# Patient Record
Sex: Female | Born: 1935 | Race: White | Hispanic: No | Marital: Married | Smoking: Former smoker
Health system: Southern US, Community
[De-identification: ages and names within clinical notes are randomized; demographics above are authoritative.]

## PROBLEM LIST (undated history)

## (undated) DIAGNOSIS — E039 Hypothyroidism, unspecified: Secondary | ICD-10-CM

## (undated) DIAGNOSIS — M549 Dorsalgia, unspecified: Secondary | ICD-10-CM

## (undated) DIAGNOSIS — M1990 Unspecified osteoarthritis, unspecified site: Secondary | ICD-10-CM

## (undated) DIAGNOSIS — K219 Gastro-esophageal reflux disease without esophagitis: Secondary | ICD-10-CM

## (undated) DIAGNOSIS — R001 Bradycardia, unspecified: Secondary | ICD-10-CM

## (undated) DIAGNOSIS — I729 Aneurysm of unspecified site: Secondary | ICD-10-CM

## (undated) DIAGNOSIS — R519 Headache, unspecified: Secondary | ICD-10-CM

## (undated) DIAGNOSIS — I10 Essential (primary) hypertension: Secondary | ICD-10-CM

## (undated) DIAGNOSIS — R51 Headache: Secondary | ICD-10-CM

## (undated) DIAGNOSIS — E7800 Pure hypercholesterolemia, unspecified: Secondary | ICD-10-CM

## (undated) HISTORY — PX: CATARACT EXTRACTION, BILATERAL: SHX1313

## (undated) HISTORY — PX: TONSILLECTOMY: SUR1361

## (undated) HISTORY — PX: APPENDECTOMY: SHX54

---

## 2013-08-14 ENCOUNTER — Other Ambulatory Visit (HOSPITAL_COMMUNITY): Payer: Self-pay | Admitting: Neurology

## 2013-08-14 DIAGNOSIS — I729 Aneurysm of unspecified site: Secondary | ICD-10-CM

## 2013-08-18 ENCOUNTER — Telehealth (HOSPITAL_COMMUNITY): Payer: Self-pay | Admitting: Interventional Radiology

## 2013-08-18 ENCOUNTER — Ambulatory Visit (HOSPITAL_COMMUNITY): Admission: RE | Admit: 2013-08-18 | Payer: PRIVATE HEALTH INSURANCE | Source: Ambulatory Visit

## 2013-08-18 NOTE — Telephone Encounter (Signed)
Called pt left VM to find out if she was coming for her 1 p.m. appt with Deveshwar. Told her to call us back and let us know if she wanted to reschedule or cancel coming in to see Dr. Corliss Skainseveshwar. JM

## 2013-08-29 ENCOUNTER — Ambulatory Visit (HOSPITAL_COMMUNITY)
Admission: RE | Admit: 2013-08-29 | Discharge: 2013-08-29 | Disposition: A | Discharge status: Home / Self Care | Payer: PRIVATE HEALTH INSURANCE | Source: Ambulatory Visit | Attending: Neurology | Admitting: Neurology

## 2013-08-29 DIAGNOSIS — I729 Aneurysm of unspecified site: Secondary | ICD-10-CM

## 2013-08-31 ENCOUNTER — Other Ambulatory Visit (HOSPITAL_COMMUNITY): Payer: Self-pay | Admitting: Interventional Radiology

## 2013-08-31 ENCOUNTER — Other Ambulatory Visit: Payer: Self-pay | Admitting: Radiology

## 2013-08-31 DIAGNOSIS — R519 Headache, unspecified: Secondary | ICD-10-CM

## 2013-08-31 DIAGNOSIS — R42 Dizziness and giddiness: Secondary | ICD-10-CM

## 2013-08-31 DIAGNOSIS — I729 Aneurysm of unspecified site: Secondary | ICD-10-CM

## 2013-08-31 DIAGNOSIS — R51 Headache: Secondary | ICD-10-CM

## 2013-08-31 DIAGNOSIS — R112 Nausea with vomiting, unspecified: Secondary | ICD-10-CM

## 2013-09-01 ENCOUNTER — Other Ambulatory Visit (HOSPITAL_COMMUNITY): Payer: Self-pay | Admitting: Interventional Radiology

## 2013-09-01 ENCOUNTER — Ambulatory Visit (HOSPITAL_COMMUNITY)
Admission: RE | Admit: 2013-09-01 | Discharge: 2013-09-01 | Disposition: A | Discharge status: Home / Self Care | Payer: PRIVATE HEALTH INSURANCE | Source: Ambulatory Visit | Attending: Interventional Radiology | Admitting: Interventional Radiology

## 2013-09-01 DIAGNOSIS — R42 Dizziness and giddiness: Secondary | ICD-10-CM

## 2013-09-01 DIAGNOSIS — M545 Low back pain, unspecified: Secondary | ICD-10-CM | POA: Insufficient documentation

## 2013-09-01 DIAGNOSIS — R51 Headache: Secondary | ICD-10-CM

## 2013-09-01 DIAGNOSIS — R519 Headache, unspecified: Secondary | ICD-10-CM

## 2013-09-01 DIAGNOSIS — R112 Nausea with vomiting, unspecified: Secondary | ICD-10-CM

## 2013-09-01 DIAGNOSIS — I10 Essential (primary) hypertension: Secondary | ICD-10-CM | POA: Insufficient documentation

## 2013-09-01 DIAGNOSIS — M810 Age-related osteoporosis without current pathological fracture: Secondary | ICD-10-CM | POA: Insufficient documentation

## 2013-09-01 DIAGNOSIS — I729 Aneurysm of unspecified site: Secondary | ICD-10-CM

## 2013-09-01 DIAGNOSIS — I671 Cerebral aneurysm, nonruptured: Secondary | ICD-10-CM | POA: Insufficient documentation

## 2013-09-01 DIAGNOSIS — R413 Other amnesia: Secondary | ICD-10-CM | POA: Insufficient documentation

## 2013-09-01 LAB — CBC
HEMATOCRIT: 39.6 % (ref 36.0–46.0)
Hemoglobin: 13.7 g/dL (ref 12.0–15.0)
MCH: 31.4 pg (ref 26.0–34.0)
MCHC: 34.6 g/dL (ref 30.0–36.0)
MCV: 90.8 fL (ref 78.0–100.0)
PLATELETS: 129 10*3/uL — AB (ref 150–400)
RBC: 4.36 MIL/uL (ref 3.87–5.11)
RDW: 12.8 % (ref 11.5–15.5)
WBC: 6.4 10*3/uL (ref 4.0–10.5)

## 2013-09-01 LAB — BASIC METABOLIC PANEL
BUN: 16 mg/dL (ref 6–23)
CALCIUM: 9.9 mg/dL (ref 8.4–10.5)
CHLORIDE: 103 meq/L (ref 96–112)
CO2: 26 mEq/L (ref 19–32)
CREATININE: 0.9 mg/dL (ref 0.50–1.10)
GFR calc Af Amer: 66 mL/min — ABNORMAL LOW (ref >90)
GFR, EST NON AFRICAN AMERICAN: 57 mL/min — AB (ref >90)
Glucose, Bld: 112 mg/dL — ABNORMAL HIGH (ref 70–99)
Potassium: 4.1 mEq/L (ref 3.7–5.3)
Sodium: 142 mEq/L (ref 137–147)

## 2013-09-01 LAB — PROTIME-INR
INR: 1.03 (ref 0.00–1.49)
PROTHROMBIN TIME: 13.3 s (ref 11.6–15.2)

## 2013-09-01 LAB — APTT: APTT: 31 s (ref 24–37)

## 2013-09-01 MED ORDER — MIDAZOLAM HCL 2 MG/2ML IJ SOLN
INTRAMUSCULAR | Status: AC | PRN
  Administered 2013-09-01: 1 mg via INTRAVENOUS
  Administered 2013-09-01 (×2): 0.5 mg via INTRAVENOUS

## 2013-09-01 MED ORDER — IOHEXOL 300 MG/ML  SOLN
150.0000 mL | Freq: Once | INTRAMUSCULAR | Status: AC | PRN
  Administered 2013-09-01: 65 mL via INTRAVENOUS

## 2013-09-01 MED ORDER — FENTANYL CITRATE 0.05 MG/ML IJ SOLN
INTRAMUSCULAR | Status: AC
  Filled 2013-09-01: qty 4

## 2013-09-01 MED ORDER — MIDAZOLAM HCL 2 MG/2ML IJ SOLN
INTRAMUSCULAR | Status: AC
  Filled 2013-09-01: qty 4

## 2013-09-01 MED ORDER — SODIUM CHLORIDE 0.9 % IV SOLN: Freq: Once | INTRAVENOUS | Status: DC

## 2013-09-01 MED ORDER — HEPARIN SODIUM (PORCINE) 1000 UNIT/ML IJ SOLN
INTRAMUSCULAR | Status: AC | PRN
  Administered 2013-09-01: 1000 [IU] via INTRAVENOUS

## 2013-09-01 MED ORDER — SODIUM CHLORIDE 0.9 % IV SOLN: INTRAVENOUS | Status: AC

## 2013-09-01 MED ORDER — FENTANYL CITRATE 0.05 MG/ML IJ SOLN
INTRAMUSCULAR | Status: AC | PRN
  Administered 2013-09-01 (×2): 12.5 ug via INTRAVENOUS
  Administered 2013-09-01: 25 ug via INTRAVENOUS

## 2013-09-01 NOTE — Discharge Instructions (Signed)
Angiography, Care After °Refer to this sheet in the next few weeks. These instructions provide you with information on caring for yourself after your procedure. Your health care provider may also give you more specific instructions. Your treatment has been planned according to current medical practices, but problems sometimes occur. Call your health care provider if you have any problems or questions after your procedure.  °WHAT TO EXPECT AFTER THE PROCEDURE °After your procedure, it is typical to have the following sensations: °· Minor discomfort or tenderness and a small bump at the catheter insertion site. The bump should usually decrease in size and tenderness within 1 to 2 weeks. °· Any bruising will usually fade within 2 to 4 weeks. °HOME CARE INSTRUCTIONS  °· You may need to keep taking blood thinners if they were prescribed for you. Only take over-the-counter or prescription medicines for pain, fever, or discomfort as directed by your health care provider. °· Do not apply powder or lotion to the site. °· Do not sit in a bathtub, swimming pool, or whirlpool for 5 to 7 days. °· You may shower 24 hours after the procedure. Remove the bandage (dressing) and gently wash the site with plain soap and water. Gently pat the site dry. °· Inspect the site at least twice daily. °· Limit your activity for the first 48 hours. Do not bend, squat, or lift anything over 20 lb (9 kg) or as directed by your health care provider. °· Do not drive home if you are discharged the day of the procedure. Have someone else drive you. Follow instructions about when you can drive or return to work. °SEEK MEDICAL CARE IF: °· You get lightheaded when standing up. °· You have drainage (other than a small amount of blood on the dressing). °· You have chills. °· You have a fever. °· You have redness, warmth, swelling, or pain at the insertion site. °SEEK IMMEDIATE MEDICAL CARE IF:  °· You develop chest pain or shortness of breath, feel faint,  or pass out. °· You have bleeding, swelling larger than a walnut, or drainage from the catheter insertion site. °· You develop pain, discoloration, coldness, or severe bruising in the leg or arm that held the catheter. °· You develop bleeding from any other place, such as the bowels. You may see bright red blood in your urine or stools, or your stools may appear black and tarry. °· You have heavy bleeding from the site. If this happens, hold pressure on the site. °MAKE SURE YOU: °· Understand these instructions. °· Will watch your condition. °· Will get help right away if you are not doing well or get worse.  Call 911 if bleeding is not under control °Document Released: 12/25/2004 Document Revised: 02/08/2013 Document Reviewed: 10/31/2012 °[employer]® Patient Information ©2014 [employer]. ° °

## 2013-09-01 NOTE — Sedation Documentation (Signed)
MD at bedside, to relay diagnostic findings and future treatment plan discussed

## 2013-09-01 NOTE — Procedures (Signed)
S/P 4 vessel cerebral arteriogram RT CFA approach. Findings. 1.Approx 6.818mm x 4.5 mm wide necked LT ICA post wall intracranial aneurysm

## 2013-09-01 NOTE — H&P (Signed)
Kara ClinesLucy Lester is an 80 y.o. female.   Chief Complaint: headaches, left internal carotid artery aneurysm HPI: Pt with history of persistent headaches, primarily right frontal region, for past 2 years as well as recent episode associated with nausea and dizziness. Recent MRA head/neck at outside facility revealed left ICA aneurysm. She presents today for diagnostic cerebral arteriogram to assess vasculature.                                                                                                                                                                                            Surgical history: Appendectomy; tonsillectomy ; bilateral cataract  surgery  Past Medical History: Hypertension; headache; low back pain; memory  loss; osteoporosis                                                                                                                                                                Social History: Patient is married and [place]. She is with her husband. She is a  nonsmoker and nondrinker. She is retired Astronomerteacher/ counselor.                                                                                             Family History: Patient's mother died at age 78 of CAD; father died  with CAD 78 years old. She does not have any siblings  Allergies: penicillin Current outpatient prescriptions:amitriptyline (ELAVIL) 10 MG tablet, Take 10 mg by mouth at bedtime., Disp: , Rfl: ;  aspirin EC 81 MG tablet, Take 81 mg by mouth daily., Disp: , Rfl: ;  atenolol (TENORMIN) 50 MG tablet, Take 50-100 mg by mouth every other day., Disp: , Rfl: ;  atorvastatin (LIPITOR) 10 MG tablet, Take 10 mg by mouth daily at 6 PM., Disp: , Rfl:  bimatoprost (LUMIGAN) 0.01 % SOLN,  Place 1 drop into both eyes at bedtime., Disp: , Rfl: ;  butalbital-acetaminophen-caffeine (FIORICET, ESGIC) 50-325-40 MG per tablet, Take 1 tablet by mouth every 8 (eight) hours as needed for headache., Disp: , Rfl: ;  calcium-vitamin D (OSCAL WITH D) 500-200 MG-UNIT per tablet, Take 1 tablet by mouth 2 (two) times daily., Disp: , Rfl:  gabapentin (NEURONTIN) 300 MG capsule, Take 300 mg by mouth 2 (two) times daily., Disp: , Rfl: ;  indapamide (LOZOL) 1.25 MG tablet, Take 1.25 mg by mouth daily. , Disp: , Rfl: ;  Multiple Vitamin (MULTIVITAMIN WITH MINERALS) TABS tablet, Take 1 tablet by mouth daily., Disp: , Rfl:  Current facility-administered medications:0.9 %  sodium chloride infusion, , Intravenous, Once, Berneta Levins, PA-C   Results for orders placed during the hospital encounter of 09/01/13  CBC      Result Value Ref Range   WBC 6.4  4.0 - 10.5 K/uL   RBC 4.36  3.87 - 5.11 MIL/uL   Hemoglobin 13.7  12.0 - 15.0 g/dL   HCT 16.1  09.6 - 04.5 %   MCV 90.8  78.0 - 100.0 fL   MCH 31.4  26.0 - 34.0 pg   MCHC 34.6  30.0 - 36.0 g/dL   RDW 40.9  81.1 - 91.4 %   Platelets 129 (*) 150 - 400 K/uL    Review of Systems  Constitutional: Negative for fever and chills.  Eyes: Negative for blurred vision and double vision.  Respiratory: Negative for hemoptysis and shortness of breath.        Occ cough  Cardiovascular: Negative for chest pain.  Gastrointestinal: Negative for abdominal pain.       Occ N/V  Genitourinary: Negative for hematuria.  Musculoskeletal: Positive for back pain.  Neurological: Positive for dizziness and headaches. Negative for tingling, tremors, sensory change, speech change, focal weakness, seizures and loss of consciousness.  Endo/Heme/Allergies: Does not bruise/bleed easily.  Psychiatric/Behavioral: The patient is nervous/anxious.        Some trouble remembering names    Blood pressure 130/75, pulse 55, temperature 98.1 F (36.7 C), temperature source Oral, resp.  rate 18, height 5\' 3"  (1.6 m), weight 152 lb (68.947 kg), SpO2 100.00%. Physical Exam  Constitutional: She is oriented to person, place, and time. She appears well-developed and well-nourished.  HENT:  Head: Normocephalic and atraumatic.  Eyes: EOM are normal. Pupils are equal, round, and reactive to light.  Cardiovascular:  Sl brady but reg rhythm  Respiratory: Effort normal.  Few left basilar crackles, right clear  GI: Soft. Bowel sounds are normal. There is no tenderness.  Musculoskeletal: Normal range of motion. She exhibits no edema.  Neurological: She is alert and oriented to person, place, and time. No cranial nerve deficit. Coordination normal.  Skin: Skin is warm and dry.     Assessment/Plan Pt with history of persistent headaches, primarily right frontal region, for past 2 years as well as recent episode associated with nausea and dizziness. Recent MRA head/neck at outside facility revealed left ICA aneurysm.  She presents today for diagnostic cerebral arteriogram to assess vasculature. Details/risks of procedure d/w pt/husband with their understanding and consent.  Kara Lester,D KEVIN 09/01/2013, 10:43 AM

## 2013-09-01 NOTE — Sedation Documentation (Signed)
Patient denies pain and is resting comfortably.  

## 2013-09-01 NOTE — Progress Notes (Signed)
Received pt from Radiology alert and denies any discomfort.

## 2013-09-01 NOTE — Progress Notes (Signed)
Discharge instruction given per MD order.  Pt and CG able to verbalize understanding.  Pt to car via wheelchair. 

## 2013-09-04 ENCOUNTER — Other Ambulatory Visit (HOSPITAL_COMMUNITY): Payer: Self-pay | Admitting: Interventional Radiology

## 2013-09-04 DIAGNOSIS — I729 Aneurysm of unspecified site: Secondary | ICD-10-CM

## 2013-09-13 ENCOUNTER — Ambulatory Visit (HOSPITAL_COMMUNITY)
Admission: RE | Admit: 2013-09-13 | Discharge: 2013-09-13 | Disposition: A | Discharge status: Home / Self Care | Payer: PRIVATE HEALTH INSURANCE | Source: Ambulatory Visit | Attending: Interventional Radiology | Admitting: Interventional Radiology

## 2013-09-13 DIAGNOSIS — I729 Aneurysm of unspecified site: Secondary | ICD-10-CM

## 2013-09-14 ENCOUNTER — Telehealth (HOSPITAL_COMMUNITY): Payer: Self-pay | Admitting: Interventional Radiology

## 2013-09-14 NOTE — Telephone Encounter (Signed)
Pt called to tell me that she would like to take a week or so to really think about whether or not she wants to go through with the proposed procedure. She will call me back when she has made her decision. JM

## 2013-10-03 ENCOUNTER — Other Ambulatory Visit (HOSPITAL_COMMUNITY): Payer: Self-pay | Admitting: Interventional Radiology

## 2015-08-08 NOTE — H&P (Signed)
TOTAL HIP ADMISSION H&P  Patient is admitted for left total hip arthroplasty, anterior approach.  Subjective:  Chief Complaint:   Left hip primary OA / pain  HPI: Kara Lester, 82 y.o. female, has a history of pain and functional disability in the left hip(s) due to arthritis and patient has failed non-surgical conservative treatments for greater than 12 weeks to include NSAID's and/or analgesics, corticosteriod injections, use of assistive devices and activity modification.  Onset of symptoms was gradual starting 2+ years ago with gradually worsening course since that time.The patient noted no past surgery on the left hip(s).  Patient currently rates pain in the left hip at 8 out of 10 with activity. Patient has night pain, worsening of pain with activity and weight bearing, trendelenberg gait, pain that interfers with activities of daily living and pain with passive range of motion. Patient has evidence of periarticular osteophytes and joint space narrowing by imaging studies. This condition presents safety issues increasing the risk of falls.  There is no current active infection.   Risks, benefits and expectations were discussed with the patient.  Risks including but not limited to the risk of anesthesia, blood clots, nerve damage, blood vessel damage, failure of the prosthesis, infection and up to and including death.  Patient understand the risks, benefits and expectations and wishes to proceed with surgery.   PCP: Nadara Eaton, MD  D/C Plans:      SNF  Post-op Meds:       No Rx given  Tranexamic Acid:      To be given - IV   Decadron:      Is to be given  FYI:     ASA  Norco    Past Medical History  Diagnosis Date  . Hypertension   . Heart rate slow     history of  . Aneurysm (HCC)     cerebral" no surgical repair"- frquent headaches . or lessening-Dr. Tonuzi-[phone],neurology  . Hypercholesteremia   . Hypothyroidism     childhood years- no issues  . GERD  (gastroesophageal reflux disease)   . Arthritis     Osteoarthritis- "spinal stenosis"- hip and back pain.    Past Surgical History  Procedure Laterality Date  . Cataract extraction, bilateral Bilateral   . Appendectomy    . Tonsillectomy      No prescriptions prior to admission   Allergies  Allergen Reactions  . Penicillins Rash    Has patient had a PCN reaction causing immediate rash, facial/tongue/throat swelling, SOB or lightheadedness with hypotension: Yes Has patient had a PCN reaction causing severe rash involving mucus membranes or skin necrosis: No Has patient had a PCN reaction that required hospitalization No Has patient had a PCN reaction occurring within the last 10 years: Yes If all of the above answers are "NO", then may proceed with Cephalosporin use.    Social History  Substance Use Topics  . Smoking status: Former Smoker    Types: Cigarettes  . Smokeless tobacco: Not on file     Comment: age 9- quit"social smoker only"  . Alcohol Use: Yes     Comment: rare now.       Review of Systems  Constitutional: Negative.   HENT: Negative.   Eyes: Negative.   Respiratory: Positive for cough.   Cardiovascular: Negative.   Gastrointestinal: Positive for heartburn and constipation.  Genitourinary: Negative.   Musculoskeletal: Positive for joint pain.  Skin: Negative.   Neurological: Negative.   Endo/Heme/Allergies: Negative.  Psychiatric/Behavioral: Negative.     Objective:  Physical Exam  Constitutional: She is oriented to person, place, and time. She appears well-developed.  HENT:  Head: Normocephalic.  Eyes: Pupils are equal, round, and reactive to light.  Neck: Neck supple. No JVD present. No tracheal deviation present. No thyromegaly present.  Cardiovascular: Normal rate, regular rhythm and intact distal pulses.   Murmur heard. Respiratory: Effort normal and breath sounds normal. No stridor. No respiratory distress. She has no wheezes.  GI: Soft.  There is no tenderness. There is no guarding.  Musculoskeletal:       Left hip: She exhibits decreased range of motion, decreased strength, tenderness and bony tenderness. She exhibits no swelling, no deformity and no laceration.  Lymphadenopathy:    She has no cervical adenopathy.  Neurological: She is alert and oriented to person, place, and time.  Skin: Skin is warm and dry.  Psychiatric: She has a normal mood and affect.      Labs:  Estimated body mass index is 26.93 kg/(m^2) as calculated from the following:   Height as of 09/01/13:  (1.6 m).   Weight as of 09/01/13: 68.947 kg (152 lb).   Imaging Review Plain radiographs demonstrate severe degenerative joint disease of the left hip(s). The bone quality appears to be good for age and reported activity level.  Assessment/Plan:  End stage arthritis, left hip(s)  The patient history, physical examination, clinical judgement of the provider and imaging studies are consistent with end stage degenerative joint disease of the left hip(s) and total hip arthroplasty is deemed medically necessary. The treatment options including medical management, injection therapy, arthroscopy and arthroplasty were discussed at length. The risks and benefits of total hip arthroplasty were presented and reviewed. The risks due to aseptic loosening, infection, stiffness, dislocation/subluxation,  thromboembolic complications and other imponderables were discussed.  The patient acknowledged the explanation, agreed to proceed with the plan and consent was signed. Patient is being admitted for inpatient treatment for surgery, pain control, PT, OT, prophylactic antibiotics, VTE prophylaxis, progressive ambulation and ADL's and discharge planning.The patient is planning to be discharged to skilled nursing facility.     Anastasio Auerbach Jennifr Gaeta   PA-C  08/19/2015, 3:21 PM

## 2015-08-19 ENCOUNTER — Encounter (HOSPITAL_COMMUNITY): Payer: Self-pay

## 2015-08-19 ENCOUNTER — Encounter (HOSPITAL_COMMUNITY)
Admission: RE | Admit: 2015-08-19 | Discharge: 2015-08-19 | Disposition: A | Discharge status: Home / Self Care | Payer: Medicare Other | Source: Ambulatory Visit | Attending: Orthopedic Surgery | Admitting: Orthopedic Surgery

## 2015-08-19 DIAGNOSIS — M1612 Unilateral primary osteoarthritis, left hip: Secondary | ICD-10-CM | POA: Diagnosis not present

## 2015-08-19 DIAGNOSIS — Z01812 Encounter for preprocedural laboratory examination: Secondary | ICD-10-CM | POA: Insufficient documentation

## 2015-08-19 DIAGNOSIS — Z0183 Encounter for blood typing: Secondary | ICD-10-CM | POA: Diagnosis not present

## 2015-08-19 HISTORY — DX: Essential (primary) hypertension: I10

## 2015-08-19 HISTORY — DX: Bradycardia, unspecified: R00.1

## 2015-08-19 HISTORY — DX: Pure hypercholesterolemia, unspecified: E78.00

## 2015-08-19 HISTORY — DX: Unspecified osteoarthritis, unspecified site: M19.90

## 2015-08-19 HISTORY — DX: Hypothyroidism, unspecified: E03.9

## 2015-08-19 HISTORY — DX: Aneurysm of unspecified site: I72.9

## 2015-08-19 HISTORY — DX: Gastro-esophageal reflux disease without esophagitis: K21.9

## 2015-08-19 LAB — CBC
HEMATOCRIT: 41.4 % (ref 36.0–46.0)
Hemoglobin: 13.4 g/dL (ref 12.0–15.0)
MCH: 31.3 pg (ref 26.0–34.0)
MCHC: 32.4 g/dL (ref 30.0–36.0)
MCV: 96.7 fL (ref 78.0–100.0)
Platelets: 137 10*3/uL — ABNORMAL LOW (ref 150–400)
RBC: 4.28 MIL/uL (ref 3.87–5.11)
RDW: 13.6 % (ref 11.5–15.5)
WBC: 7.6 10*3/uL (ref 4.0–10.5)

## 2015-08-19 LAB — PROTIME-INR
INR: 1.13 (ref 0.00–1.49)
Prothrombin Time: 14.3 seconds (ref 11.6–15.2)

## 2015-08-19 LAB — URINALYSIS, ROUTINE W REFLEX MICROSCOPIC
Bilirubin Urine: NEGATIVE
Glucose, UA: NEGATIVE mg/dL
HGB URINE DIPSTICK: NEGATIVE
Ketones, ur: NEGATIVE mg/dL
Nitrite: NEGATIVE
PROTEIN: NEGATIVE mg/dL
Specific Gravity, Urine: 1.004 — ABNORMAL LOW (ref 1.005–1.030)
pH: 5.5 (ref 5.0–8.0)

## 2015-08-19 LAB — URINE MICROSCOPIC-ADD ON

## 2015-08-19 LAB — BASIC METABOLIC PANEL
Anion gap: 8 (ref 5–15)
BUN: 17 mg/dL (ref 6–20)
CHLORIDE: 104 mmol/L (ref 101–111)
CO2: 27 mmol/L (ref 22–32)
Calcium: 9.8 mg/dL (ref 8.9–10.3)
Creatinine, Ser: 0.74 mg/dL (ref 0.44–1.00)
GFR calc Af Amer: >60 mL/min (ref >60)
GFR calc non Af Amer: >60 mL/min (ref >60)
GLUCOSE: 106 mg/dL — AB (ref 65–99)
POTASSIUM: 5.6 mmol/L — AB (ref 3.5–5.1)
Sodium: 139 mmol/L (ref 135–145)

## 2015-08-19 LAB — SURGICAL PCR SCREEN
MRSA, PCR: NEGATIVE
STAPHYLOCOCCUS AUREUS: NEGATIVE

## 2015-08-19 LAB — APTT: aPTT: 33 seconds (ref 24–37)

## 2015-08-19 LAB — ABO/RH: ABO/RH(D): A POS

## 2015-08-19 NOTE — Pre-Procedure Instructions (Signed)
EKG 08-12-15 with chart . LOV notes Dr. Leanora Cover note with chart.

## 2015-08-19 NOTE — Patient Instructions (Signed)
Kara Lester  08/19/2015   Your procedure is scheduled on: 08-27-15   Report to [facility] Main  Entrance take [facility]  elevators to 3rd floor to  Short Stay Center at   0830 AM.  Call this number if you have problems the morning of surgery [phone]   Remember: ONLY 1 PERSON MAY GO WITH YOU TO SHORT STAY TO GET  READY MORNING OF YOUR SURGERY.  Do not eat food or drink liquids :After Midnight.     Take these medicines the morning of surgery with A SIP OF WATER: Atenolol. Gabapentin. Use/bring eye drops. DO NOT TAKE ANY DIABETIC MEDICATIONS DAY OF YOUR SURGERY                               You may not have any metal on your body including hair pins and              piercings  Do not wear jewelry, make-up, lotions, powders or perfumes, deodorant             Do not wear nail polish.  Do not shave  48 hours prior to surgery.              Men may shave face and neck.   Do not bring valuables to the hospital. [facility] IS NOT             RESPONSIBLE   FOR VALUABLES.  Contacts, dentures or bridgework may not be worn into surgery.  Leave suitcase in the car. After surgery it may be brought to your room.     Patients discharged the day of surgery will not be allowed to drive home.  Name and phone number of your driver: Kara Lester -[phone]   Special Instructions: N/A              Please read over the following fact sheets you were given: _____________________________________________________________________             [facility] - Preparing for Surgery Before surgery, you can play an important role.  Because skin is not sterile, your skin needs to be as free of germs as possible.  You can reduce the number of germs on your skin by washing with CHG (chlorahexidine gluconate) soap before surgery.  CHG is an antiseptic cleaner which kills germs and bonds with the skin to continue killing germs even after washing. Please DO NOT use if you have an allergy  to CHG or antibacterial soaps.  If your skin becomes reddened/irritated stop using the CHG and inform your nurse when you arrive at Short Stay. Do not shave (including legs and underarms) for at least 48 hours prior to the first CHG shower.  You may shave your face/neck. Please follow these instructions carefully:  1.  Shower with CHG Soap the night before surgery and the  morning of Surgery.  2.  If you choose to wash your hair, wash your hair first as usual with your  normal  shampoo.  3.  After you shampoo, rinse your hair and body thoroughly to remove the  shampoo.                           4.  Use CHG as you would any other liquid soap.  You can  apply chg directly  to the skin and wash                       Gently with a scrungie or clean washcloth.  5.  Apply the CHG Soap to your body ONLY FROM THE NECK DOWN.   Do not use on face/ open                           Wound or open sores. Avoid contact with eyes, ears mouth and genitals (private parts).                       Wash face,  Genitals (private parts) with your normal soap.             6.  Wash thoroughly, paying special attention to the area where your surgery  will be performed.  7.  Thoroughly rinse your body with warm water from the neck down.  8.  DO NOT shower/wash with your normal soap after using and rinsing off  the CHG Soap.                9.  Pat yourself dry with a clean towel.            10.  Wear clean pajamas.            11.  Place clean sheets on your bed the night of your first shower and do not  sleep with pets. Day of Surgery : Do not apply any lotions/deodorants the morning of surgery.  Please wear clean clothes to the hospital/surgery center.  FAILURE TO FOLLOW THESE INSTRUCTIONS MAY RESULT IN THE CANCELLATION OF YOUR SURGERY PATIENT SIGNATURE_________________________________  NURSE SIGNATURE__________________________________  ________________________________________________________________________   Kara Lester  An incentive spirometer is a tool that can help keep your lungs clear and active. This tool measures how well you are filling your lungs with each breath. Taking long deep breaths may help reverse or decrease the chance of developing breathing (pulmonary) problems (especially infection) following:  A long period of time when you are unable to move or be active. BEFORE THE PROCEDURE   If the spirometer includes an indicator to show your best effort, your nurse or respiratory therapist will set it to a desired goal.  If possible, sit up straight or lean slightly forward. Try not to slouch.  Hold the incentive spirometer in an upright position. INSTRUCTIONS FOR USE   Sit on the edge of your bed if possible, or sit up as far as you can in bed or on a chair.  Hold the incentive spirometer in an upright position.  Breathe out normally.  Place the mouthpiece in your mouth and seal your lips tightly around it.  Breathe in slowly and as deeply as possible, raising the piston or the ball toward the top of the column.  Hold your breath for 3-5 seconds or for as long as possible. Allow the piston or ball to fall to the bottom of the column.  Remove the mouthpiece from your mouth and breathe out normally.  Rest for a few seconds and repeat Steps 1 through 7 at least 10 times every 1-2 hours when you are awake. Take your time and take a few normal breaths between deep breaths.  The spirometer may include an indicator to show your best effort. Use the indicator as a goal to work toward during each  repetition.  After each set of 10 deep breaths, practice coughing to be sure your lungs are clear. If you have an incision (the cut made at the time of surgery), support your incision when coughing by placing a pillow or rolled up towels firmly against it. Once you are able to get out of bed, walk around indoors and cough well. You may stop using the incentive spirometer when instructed by  your caregiver.  RISKS AND COMPLICATIONS  Take your time so you do not get dizzy or light-headed.  If you are in pain, you may need to take or ask for pain medication before doing incentive spirometry. It is harder to take a deep breath if you are having pain. AFTER USE  Rest and breathe slowly and easily.  It can be helpful to keep track of a log of your progress. Your caregiver can provide you with a simple table to help with this. If you are using the spirometer at home, follow these instructions: [employer] IF:   You are having difficultly using the spirometer.  You have trouble using the spirometer as often as instructed.  Your pain medication is not giving enough relief while using the spirometer.  You develop fever of 100.5 F (38.1 C) or higher. SEEK IMMEDIATE MEDICAL CARE IF:   You cough up bloody sputum that had not been present before.  You develop fever of 102 F (38.9 C) or greater.  You develop worsening pain at or near the incision site. MAKE SURE YOU:   Understand these instructions.  Will watch your condition.  Will get help right away if you are not doing well or get worse. Document Released: 10/19/2006 Document Revised: 08/31/2011 Document Reviewed: 12/20/2006 Exit[employer] Patient Information 2014 Exit[employer], [place].   ________________________________________________________________________  WHAT IS A BLOOD TRANSFUSION? Blood Transfusion Information  A transfusion is the replacement of blood or some of its parts. Blood is made up of multiple cells which provide different functions.  Red blood cells carry oxygen and are used for blood loss replacement.  White blood cells fight against infection.  Platelets control bleeding.  Plasma helps clot blood.  Other blood products are available for specialized needs, such as hemophilia or other clotting disorders. BEFORE THE TRANSFUSION  Who gives blood for transfusions?   Healthy volunteers who are  fully evaluated to make sure their blood is safe. This is blood bank blood. Transfusion therapy is the safest it has ever been in the practice of medicine. Before blood is taken from a donor, a complete history is taken to make sure that person has no history of diseases nor engages in risky social behavior (examples are intravenous drug use or sexual activity with multiple partners). The donor's travel history is screened to minimize risk of transmitting infections, such as malaria. The donated blood is tested for signs of infectious diseases, such as HIV and hepatitis. The blood is then tested to be sure it is compatible with you in order to minimize the chance of a transfusion reaction. If you or a relative donates blood, this is often done in anticipation of surgery and is not appropriate for emergency situations. It takes many days to process the donated blood. RISKS AND COMPLICATIONS Although transfusion therapy is very safe and saves many lives, the main dangers of transfusion include:   Getting an infectious disease.  Developing a transfusion reaction. This is an allergic reaction to something in the blood you were given. Every precaution is taken to prevent this.  The decision to have a blood transfusion has been considered carefully by your caregiver before blood is given. Blood is not given unless the benefits outweigh the risks. AFTER THE TRANSFUSION  Right after receiving a blood transfusion, you will usually feel much better and more energetic. This is especially true if your red blood cells have gotten low (anemic). The transfusion raises the level of the red blood cells which carry oxygen, and this usually causes an energy increase.  The nurse administering the transfusion will monitor you carefully for complications. HOME CARE INSTRUCTIONS  No special instructions are needed after a transfusion. You may find your energy is better. Speak with your caregiver about any limitations on  activity for underlying diseases you may have. SEEK MEDICAL CARE IF:   Your condition is not improving after your transfusion.  You develop redness or irritation at the intravenous (IV) site. SEEK IMMEDIATE MEDICAL CARE IF:  Any of the following symptoms occur over the next 12 hours:  Shaking chills.  You have a temperature by mouth above 102 F (38.9 C), not controlled by medicine.  Chest, back, or muscle pain.  People around you feel you are not acting correctly or are confused.  Shortness of breath or difficulty breathing.  Dizziness and fainting.  You get a rash or develop hives.  You have a decrease in urine output.  Your urine turns a dark color or changes to pink, red, or brown. Any of the following symptoms occur over the next 10 days:  You have a temperature by mouth above 102 F (38.9 C), not controlled by medicine.  Shortness of breath.  Weakness after normal activity.  The white part of the eye turns yellow (jaundice).  You have a decrease in the amount of urine or are urinating less often.  Your urine turns a dark color or changes to pink, red, or brown. Document Released: 06/05/2000 Document Revised: 08/31/2011 Document Reviewed: 01/23/2008 [facility] Patient Information 2014 [place], [place].  _______________________________________________________________________

## 2015-08-27 ENCOUNTER — Inpatient Hospital Stay (HOSPITAL_COMMUNITY): Payer: Medicare Other

## 2015-08-27 ENCOUNTER — Inpatient Hospital Stay (HOSPITAL_COMMUNITY): Payer: Medicare Other | Admitting: Anesthesiology

## 2015-08-27 ENCOUNTER — Encounter (HOSPITAL_COMMUNITY)
Admission: RE | Disposition: A | Discharge status: Skilled Nursing Facility | Payer: Self-pay | Source: Ambulatory Visit | Attending: Orthopedic Surgery

## 2015-08-27 ENCOUNTER — Inpatient Hospital Stay (HOSPITAL_COMMUNITY)
Admission: RE | Admit: 2015-08-27 | Discharge: 2015-08-29 | DRG: 470 | Disposition: A | Discharge status: Skilled Nursing Facility | Payer: Medicare Other | Source: Ambulatory Visit | Attending: Orthopedic Surgery | Admitting: Orthopedic Surgery

## 2015-08-27 ENCOUNTER — Encounter (HOSPITAL_COMMUNITY): Payer: Self-pay

## 2015-08-27 DIAGNOSIS — M25552 Pain in left hip: Secondary | ICD-10-CM | POA: Diagnosis present

## 2015-08-27 DIAGNOSIS — Z87891 Personal history of nicotine dependence: Secondary | ICD-10-CM

## 2015-08-27 DIAGNOSIS — M1612 Unilateral primary osteoarthritis, left hip: Principal | ICD-10-CM | POA: Diagnosis present

## 2015-08-27 DIAGNOSIS — E663 Overweight: Secondary | ICD-10-CM | POA: Diagnosis present

## 2015-08-27 DIAGNOSIS — Z01812 Encounter for preprocedural laboratory examination: Secondary | ICD-10-CM

## 2015-08-27 DIAGNOSIS — Z880 Allergy status to penicillin: Secondary | ICD-10-CM

## 2015-08-27 DIAGNOSIS — I10 Essential (primary) hypertension: Secondary | ICD-10-CM | POA: Diagnosis present

## 2015-08-27 DIAGNOSIS — Z96649 Presence of unspecified artificial hip joint: Secondary | ICD-10-CM

## 2015-08-27 DIAGNOSIS — E7800 Pure hypercholesterolemia, unspecified: Secondary | ICD-10-CM | POA: Diagnosis present

## 2015-08-27 DIAGNOSIS — Z6826 Body mass index (BMI) 26.0-26.9, adult: Secondary | ICD-10-CM

## 2015-08-27 DIAGNOSIS — K219 Gastro-esophageal reflux disease without esophagitis: Secondary | ICD-10-CM | POA: Diagnosis present

## 2015-08-27 DIAGNOSIS — E039 Hypothyroidism, unspecified: Secondary | ICD-10-CM | POA: Diagnosis present

## 2015-08-27 HISTORY — PX: TOTAL HIP ARTHROPLASTY: SHX124

## 2015-08-27 LAB — TYPE AND SCREEN
ABO/RH(D): A POS
Antibody Screen: NEGATIVE

## 2015-08-27 SURGERY — ARTHROPLASTY, HIP, TOTAL, ANTERIOR APPROACH
Anesthesia: Spinal | Site: Hip | Laterality: Left

## 2015-08-27 MED ORDER — PHENYLEPHRINE 40 MCG/ML (10ML) SYRINGE FOR IV PUSH (FOR BLOOD PRESSURE SUPPORT)
PREFILLED_SYRINGE | INTRAVENOUS | Status: AC
Start: 2015-08-27 — End: 2015-08-27
  Filled 2015-08-27: qty 10

## 2015-08-27 MED ORDER — ONDANSETRON HCL 4 MG PO TABS: 4.0000 mg | ORAL_TABLET | Freq: Four times a day (QID) | ORAL | Status: DC | PRN

## 2015-08-27 MED ORDER — DIPHENHYDRAMINE HCL 25 MG PO CAPS: 25.0000 mg | ORAL_CAPSULE | Freq: Four times a day (QID) | ORAL | Status: DC | PRN

## 2015-08-27 MED ORDER — SODIUM CHLORIDE 0.9 % IV SOLN
INTRAVENOUS | Status: DC
  Administered 2015-08-27 – 2015-08-28 (×2): via INTRAVENOUS

## 2015-08-27 MED ORDER — CEFAZOLIN SODIUM-DEXTROSE 2-3 GM-% IV SOLR
INTRAVENOUS | Status: AC
  Filled 2015-08-27: qty 50

## 2015-08-27 MED ORDER — BUPIVACAINE IN DEXTROSE 0.75-8.25 % IT SOLN
INTRATHECAL | Status: DC | PRN
  Administered 2015-08-27: 1.8 mL via INTRATHECAL

## 2015-08-27 MED ORDER — FERROUS SULFATE 325 (65 FE) MG PO TABS
325.0000 mg | ORAL_TABLET | Freq: Three times a day (TID) | ORAL | Status: DC
  Administered 2015-08-28 – 2015-08-29 (×4): 325 mg via ORAL
  Filled 2015-08-27 (×7): qty 1

## 2015-08-27 MED ORDER — LACTATED RINGERS IV SOLN
INTRAVENOUS | Status: DC
  Administered 2015-08-27: 1000 mL via INTRAVENOUS
  Administered 2015-08-27: 12:00:00 via INTRAVENOUS

## 2015-08-27 MED ORDER — METOCLOPRAMIDE HCL 5 MG/ML IJ SOLN: 5.0000 mg | Freq: Three times a day (TID) | INTRAMUSCULAR | Status: DC | PRN

## 2015-08-27 MED ORDER — POLYETHYLENE GLYCOL 3350 17 G PO PACK
17.0000 g | PACK | Freq: Two times a day (BID) | ORAL | Status: DC
  Administered 2015-08-28 – 2015-08-29 (×3): 17 g via ORAL

## 2015-08-27 MED ORDER — TRANEXAMIC ACID 1000 MG/10ML IV SOLN
1000.0000 mg | Freq: Once | INTRAVENOUS | Status: AC
  Administered 2015-08-27: 1000 mg via INTRAVENOUS
  Filled 2015-08-27: qty 10

## 2015-08-27 MED ORDER — ATORVASTATIN CALCIUM 10 MG PO TABS
10.0000 mg | ORAL_TABLET | Freq: Every evening | ORAL | Status: DC
  Administered 2015-08-27 – 2015-08-28 (×2): 10 mg via ORAL
  Filled 2015-08-27 (×3): qty 1

## 2015-08-27 MED ORDER — VANCOMYCIN HCL IN DEXTROSE 1-5 GM/200ML-% IV SOLN
1000.0000 mg | INTRAVENOUS | Status: AC
  Administered 2015-08-27: 1000 mg via INTRAVENOUS
  Filled 2015-08-27: qty 200

## 2015-08-27 MED ORDER — HYDROMORPHONE HCL 1 MG/ML IJ SOLN
INTRAMUSCULAR | Status: AC
  Filled 2015-08-27: qty 1

## 2015-08-27 MED ORDER — HYDROMORPHONE HCL 1 MG/ML IJ SOLN: 0.5000 mg | INTRAMUSCULAR | Status: DC | PRN

## 2015-08-27 MED ORDER — LATANOPROST 0.005 % OP SOLN
1.0000 [drp] | Freq: Every day | OPHTHALMIC | Status: DC
  Administered 2015-08-27 – 2015-08-28 (×2): 1 [drp] via OPHTHALMIC
  Filled 2015-08-27: qty 2.5

## 2015-08-27 MED ORDER — ATENOLOL 100 MG PO TABS
100.0000 mg | ORAL_TABLET | ORAL | Status: DC
  Administered 2015-08-29: 100 mg via ORAL
  Filled 2015-08-27: qty 1

## 2015-08-27 MED ORDER — MENTHOL 3 MG MT LOZG: 1.0000 | LOZENGE | OROMUCOSAL | Status: DC | PRN

## 2015-08-27 MED ORDER — HYDROMORPHONE HCL 1 MG/ML IJ SOLN
0.2500 mg | INTRAMUSCULAR | Status: DC | PRN
  Administered 2015-08-27 (×2): 0.5 mg via INTRAVENOUS

## 2015-08-27 MED ORDER — GABAPENTIN 300 MG PO CAPS
300.0000 mg | ORAL_CAPSULE | Freq: Three times a day (TID) | ORAL | Status: DC
  Administered 2015-08-27 – 2015-08-29 (×6): 300 mg via ORAL
  Filled 2015-08-27 (×8): qty 1

## 2015-08-27 MED ORDER — METHOCARBAMOL 1000 MG/10ML IJ SOLN
500.0000 mg | Freq: Four times a day (QID) | INTRAVENOUS | Status: DC | PRN
  Administered 2015-08-27: 500 mg via INTRAVENOUS
  Filled 2015-08-27 (×2): qty 5

## 2015-08-27 MED ORDER — METOCLOPRAMIDE HCL 10 MG PO TABS: 5.0000 mg | ORAL_TABLET | Freq: Three times a day (TID) | ORAL | Status: DC | PRN

## 2015-08-27 MED ORDER — PROPOFOL 500 MG/50ML IV EMUL
INTRAVENOUS | Status: DC | PRN
  Administered 2015-08-27: 25 ug/kg/min via INTRAVENOUS

## 2015-08-27 MED ORDER — ALUM & MAG HYDROXIDE-SIMETH 200-200-20 MG/5ML PO SUSP: 30.0000 mL | ORAL | Status: DC | PRN

## 2015-08-27 MED ORDER — PROMETHAZINE HCL 25 MG/ML IJ SOLN: 6.2500 mg | INTRAMUSCULAR | Status: DC | PRN

## 2015-08-27 MED ORDER — VANCOMYCIN HCL IN DEXTROSE 1-5 GM/200ML-% IV SOLN
1000.0000 mg | Freq: Two times a day (BID) | INTRAVENOUS | Status: AC
  Administered 2015-08-27: 1000 mg via INTRAVENOUS
  Filled 2015-08-27: qty 200

## 2015-08-27 MED ORDER — ATENOLOL 50 MG PO TABS
50.0000 mg | ORAL_TABLET | ORAL | Status: DC
  Administered 2015-08-28: 50 mg via ORAL
  Filled 2015-08-27: qty 1

## 2015-08-27 MED ORDER — PHENOL 1.4 % MT LIQD: 1.0000 | OROMUCOSAL | Status: DC | PRN

## 2015-08-27 MED ORDER — METHOCARBAMOL 500 MG PO TABS: 500.0000 mg | ORAL_TABLET | Freq: Four times a day (QID) | ORAL | Status: DC | PRN

## 2015-08-27 MED ORDER — MAGNESIUM CITRATE PO SOLN: 1.0000 | Freq: Once | ORAL | Status: DC | PRN

## 2015-08-27 MED ORDER — DEXAMETHASONE SODIUM PHOSPHATE 10 MG/ML IJ SOLN
10.0000 mg | Freq: Once | INTRAMUSCULAR | Status: AC
  Administered 2015-08-27: 10 mg via INTRAVENOUS

## 2015-08-27 MED ORDER — PHENYLEPHRINE HCL 10 MG/ML IJ SOLN
INTRAMUSCULAR | Status: DC | PRN
  Administered 2015-08-27 (×2): 40 ug via INTRAVENOUS

## 2015-08-27 MED ORDER — DEXAMETHASONE SODIUM PHOSPHATE 10 MG/ML IJ SOLN
INTRAMUSCULAR | Status: AC
  Filled 2015-08-27: qty 1

## 2015-08-27 MED ORDER — DOCUSATE SODIUM 100 MG PO CAPS
100.0000 mg | ORAL_CAPSULE | Freq: Two times a day (BID) | ORAL | Status: DC
  Administered 2015-08-27 – 2015-08-29 (×4): 100 mg via ORAL

## 2015-08-27 MED ORDER — BISACODYL 10 MG RE SUPP
10.0000 mg | Freq: Every day | RECTAL | Status: DC | PRN
  Filled 2015-08-27: qty 1

## 2015-08-27 MED ORDER — MIDAZOLAM HCL 2 MG/2ML IJ SOLN
INTRAMUSCULAR | Status: AC
  Filled 2015-08-27: qty 2

## 2015-08-27 MED ORDER — CELECOXIB 200 MG PO CAPS
200.0000 mg | ORAL_CAPSULE | Freq: Two times a day (BID) | ORAL | Status: DC
  Administered 2015-08-27 – 2015-08-29 (×4): 200 mg via ORAL
  Filled 2015-08-27 (×5): qty 1

## 2015-08-27 MED ORDER — CEFAZOLIN SODIUM-DEXTROSE 2-3 GM-% IV SOLR
2.0000 g | INTRAVENOUS | Status: DC
Start: 2015-08-27 — End: 2015-08-27

## 2015-08-27 MED ORDER — EPHEDRINE SULFATE 50 MG/ML IJ SOLN
INTRAMUSCULAR | Status: DC | PRN
  Administered 2015-08-27 (×2): 5 mg via INTRAVENOUS

## 2015-08-27 MED ORDER — ATROPINE SULFATE 0.4 MG/ML IJ SOLN
INTRAMUSCULAR | Status: DC | PRN
  Administered 2015-08-27: 0.2 mg via INTRAVENOUS

## 2015-08-27 MED ORDER — ASPIRIN EC 325 MG PO TBEC
325.0000 mg | DELAYED_RELEASE_TABLET | Freq: Two times a day (BID) | ORAL | Status: DC
Start: 2015-08-28 — End: 2015-08-29
  Administered 2015-08-28 – 2015-08-29 (×3): 325 mg via ORAL
  Filled 2015-08-27 (×5): qty 1

## 2015-08-27 MED ORDER — DEXAMETHASONE SODIUM PHOSPHATE 10 MG/ML IJ SOLN
10.0000 mg | Freq: Once | INTRAMUSCULAR | Status: AC
  Administered 2015-08-28: 10 mg via INTRAVENOUS

## 2015-08-27 MED ORDER — ONDANSETRON HCL 4 MG/2ML IJ SOLN
INTRAMUSCULAR | Status: DC | PRN
  Administered 2015-08-27: 4 mg via INTRAVENOUS

## 2015-08-27 MED ORDER — PROPOFOL 10 MG/ML IV BOLUS
INTRAVENOUS | Status: AC
  Filled 2015-08-27: qty 40

## 2015-08-27 MED ORDER — CHLORHEXIDINE GLUCONATE 4 % EX LIQD: 60.0000 mL | Freq: Once | CUTANEOUS | Status: DC

## 2015-08-27 MED ORDER — ONDANSETRON HCL 4 MG/2ML IJ SOLN: 4.0000 mg | Freq: Four times a day (QID) | INTRAMUSCULAR | Status: DC | PRN

## 2015-08-27 MED ORDER — MIDAZOLAM HCL 5 MG/5ML IJ SOLN
INTRAMUSCULAR | Status: DC | PRN
  Administered 2015-08-27: 1 mg via INTRAVENOUS

## 2015-08-27 MED ORDER — HYDROCODONE-ACETAMINOPHEN 7.5-325 MG PO TABS
1.0000 | ORAL_TABLET | ORAL | Status: DC
  Administered 2015-08-27 – 2015-08-28 (×3): 1 via ORAL
  Filled 2015-08-27 (×3): qty 1

## 2015-08-27 SURGICAL SUPPLY — 35 items
BAG DECANTER FOR FLEXI CONT (MISCELLANEOUS) IMPLANT
BAG ZIPLOCK 12X15 (MISCELLANEOUS) IMPLANT
CAPT HIP TOTAL 2 ×3 IMPLANT
CLOTH BEACON ORANGE TIMEOUT ST (SAFETY) ×3 IMPLANT
COVER PERINEAL POST (MISCELLANEOUS) ×3 IMPLANT
DRAPE STERI IOBAN 125X83 (DRAPES) ×3 IMPLANT
DRAPE U-SHAPE 47X51 STRL (DRAPES) ×6 IMPLANT
DRSG AQUACEL AG ADV 3.5X10 (GAUZE/BANDAGES/DRESSINGS) ×3 IMPLANT
DURAPREP 26ML APPLICATOR (WOUND CARE) ×3 IMPLANT
ELECT REM PT RETURN 15FT ADLT (MISCELLANEOUS) IMPLANT
ELECT REM PT RETURN 9FT ADLT (ELECTROSURGICAL) ×3
ELECTRODE REM PT RTRN 9FT ADLT (ELECTROSURGICAL) ×1 IMPLANT
GLOVE BIOGEL M 7.0 STRL (GLOVE) IMPLANT
GLOVE BIOGEL M STRL SZ7.5 (GLOVE) ×3 IMPLANT
GLOVE BIOGEL PI IND STRL 7.5 (GLOVE) ×2 IMPLANT
GLOVE BIOGEL PI IND STRL 8.5 (GLOVE) ×1 IMPLANT
GLOVE BIOGEL PI INDICATOR 7.5 (GLOVE) ×4
GLOVE BIOGEL PI INDICATOR 8.5 (GLOVE) ×2
GLOVE ECLIPSE 8.0 STRL XLNG CF (GLOVE) ×3 IMPLANT
GLOVE ORTHO TXT STRL SZ7.5 (GLOVE) ×6 IMPLANT
GOWN STRL REUS W/TWL LRG LVL3 (GOWN DISPOSABLE) ×3 IMPLANT
GOWN STRL REUS W/TWL XL LVL3 (GOWN DISPOSABLE) ×6 IMPLANT
HOLDER FOLEY CATH W/STRAP (MISCELLANEOUS) ×3 IMPLANT
LIQUID BAND (GAUZE/BANDAGES/DRESSINGS) ×3 IMPLANT
PACK ANTERIOR HIP CUSTOM (KITS) ×3 IMPLANT
SAW OSC TIP CART 19.5X105X1.3 (SAW) ×3 IMPLANT
SUT MNCRL AB 4-0 PS2 18 (SUTURE) ×3 IMPLANT
SUT VIC AB 1 CT1 36 (SUTURE) ×9 IMPLANT
SUT VIC AB 2-0 CT1 27 (SUTURE) ×4
SUT VIC AB 2-0 CT1 TAPERPNT 27 (SUTURE) ×2 IMPLANT
SUT VLOC 180 0 24IN GS25 (SUTURE) ×3 IMPLANT
TRAY FOLEY W/METER SILVER 14FR (SET/KITS/TRAYS/PACK) ×3 IMPLANT
TRAY FOLEY W/METER SILVER 16FR (SET/KITS/TRAYS/PACK) IMPLANT
WATER STERILE IRR 1500ML POUR (IV SOLUTION) ×3 IMPLANT
YANKAUER SUCT BULB TIP 10FT TU (MISCELLANEOUS) ×3 IMPLANT

## 2015-08-27 NOTE — Progress Notes (Signed)
X-ray results noted 

## 2015-08-27 NOTE — Anesthesia Preprocedure Evaluation (Addendum)
Anesthesia Evaluation  Patient identified by MRN, date of birth, ID band Patient awake    Reviewed: Allergy & Precautions, NPO status , Patient's Chart, lab work & pertinent test results  Airway Mallampati: II  TM Distance: >3 FB Neck ROM: Full    Dental no notable dental hx.    Pulmonary neg pulmonary ROS, former smoker,    Pulmonary exam normal breath sounds clear to auscultation       Cardiovascular hypertension, Pt. on medications and Pt. on home beta blockers Normal cardiovascular exam Rhythm:Regular Rate:Normal     Neuro/Psych negative neurological ROS  negative psych ROS   GI/Hepatic Neg liver ROS, GERD  ,  Endo/Other  Hypothyroidism   Renal/GU negative Renal ROS  negative genitourinary   Musculoskeletal negative musculoskeletal ROS (+)   Abdominal   Peds negative pediatric ROS (+)  Hematology negative hematology ROS (+)   Anesthesia Other Findings   Reproductive/Obstetrics negative OB ROS                             Anesthesia Physical Anesthesia Plan  ASA: II  Anesthesia Plan: Spinal   Post-op Pain Management:    Induction: Intravenous  Airway Management Planned: Simple Face Mask  Additional Equipment:   Intra-op Plan:   Post-operative Plan:   Informed Consent: I have reviewed the patients History and Physical, chart, labs and discussed the procedure including the risks, benefits and alternatives for the proposed anesthesia with the patient or authorized representative who has indicated his/her understanding and acceptance.   Dental advisory given  Plan Discussed with: CRNA and Surgeon  Anesthesia Plan Comments:         Anesthesia Quick Evaluation

## 2015-08-27 NOTE — Progress Notes (Signed)
Portable AP and Lateral Left Hip X-rays done. 

## 2015-08-27 NOTE — Interval H&P Note (Signed)
History and Physical Interval Note:  08/27/2015 10:09 AM  Kara DunnLucy L Lester  has presented today for surgery, with the diagnosis of LEFT HIP OA   The various methods of treatment have been discussed with the patient and family. After consideration of risks, benefits and other options for treatment, the patient has consented to  Procedure(s): LEFT TOTAL HIP ARTHROPLASTY ANTERIOR APPROACH (Left) as a surgical intervention .  The patient's history has been reviewed, patient examined, no change in status, stable for surgery.  I have reviewed the patient's chart and labs.  Questions were answered to the patient's satisfaction.     Shelda PalLIN,Skeeter Sheard D

## 2015-08-27 NOTE — Progress Notes (Signed)
Dr. Okey Dupreose in and made aware of EKG readings and rates

## 2015-08-27 NOTE — Transfer of Care (Signed)
Immediate Anesthesia Transfer of Care Note  Patient: Kara Lester  Procedure(s) Performed: Procedure(s): LEFT TOTAL HIP ARTHROPLASTY ANTERIOR APPROACH (Left)  Patient Location: PACU  Anesthesia Type:MAC and Spinal  Level of Consciousness: awake, alert  and oriented  Airway & Oxygen Therapy: Patient Spontanous Breathing and Patient connected to face mask oxygen  Post-op Assessment: Report given to RN and Post -op Vital signs reviewed and stable  Post vital signs: Reviewed and stable  Last Vitals:  Filed Vitals:   08/27/15 0831  BP: 160/87  Pulse: 60  Temp: 36.7 C  Resp: 18    Complications: No apparent anesthesia complications

## 2015-08-27 NOTE — Op Note (Signed)
NAME:  Kara Lester                ACCOUNT NO.: [id number]      MEDICAL RECORD NO.: [id number]      FACILITY:  [facility]      PHYSICIAN:  Durene Romans D  DATE OF BIRTH:  1927-06-06     DATE OF PROCEDURE:  08/27/2015                                 OPERATIVE REPORT         PREOPERATIVE DIAGNOSIS: Left  hip osteoarthritis.      POSTOPERATIVE DIAGNOSIS:  Left hip osteoarthritis.      PROCEDURE:  Left total hip replacement through an anterior approach   utilizing DePuy THR system, component size 50mm pinnacle cup, a size 32+4 neutral   Altrex liner, a size 7 Hi Tri Lock stem with a 32+1 Articuleze metal head ball.      SURGEON:  Madlyn Frankel. Charlann Boxer, M.D.      ASSISTANT:  Skip Mayer, PA-C      ANESTHESIA:  Spinal.      SPECIMENS:  None.      COMPLICATIONS:  None.      BLOOD LOSS:  300 cc     DRAINS:  None      INDICATION OF THE PROCEDURE:  Kara Lester is a 82 y.o. female who had   presented to office for evaluation of left hip pain.  Radiographs revealed   progressive degenerative changes with bone-on-bone   articulation to the  hip joint.  The patient had painful limited range of   motion significantly affecting their overall quality of life.  The patient was failing to    respond to conservative measures, and at this point was ready   to proceed with more definitive measures.  The patient has noted progressive   degenerative changes in his hip, progressive problems and dysfunction   with regarding the hip prior to surgery.  Consent was obtained for   benefit of pain relief.  Specific risk of infection, DVT, component   failure, dislocation, need for revision surgery, as well discussion of   the anterior versus posterior approach were reviewed.  Consent was   obtained for benefit of anterior pain relief through an anterior   approach.      PROCEDURE IN DETAIL:  The patient was brought to operative theater.   Once adequate anesthesia, preoperative  antibiotics, 1gm of Vancomycin, 1 gm of Tranexamic Acid, and 10 mg of Decadron administered.   The patient was positioned supine on the OSI Hanna table.  Once adequate   padding of boney process was carried out, we had predraped out the hip, and  used fluoroscopy to confirm orientation of the pelvis and position.      The left hip was then prepped and draped from proximal iliac crest to   mid thigh with shower curtain technique.      Time-out was performed identifying the patient, planned procedure, and   extremity.     An incision was then made 2 cm distal and lateral to the   anterior superior iliac spine extending over the orientation of the   tensor fascia lata muscle and sharp dissection was carried down to the   fascia of the muscle and protractor placed in the soft tissues.      The fascia was  then incised.  The muscle belly was identified and swept   laterally and retractor placed along the superior neck.  Following   cauterization of the circumflex vessels and removing some pericapsular   fat, a second cobra retractor was placed on the inferior neck.  A third   retractor was placed on the anterior acetabulum after elevating the   anterior rectus.  A L-capsulotomy was along the line of the   superior neck to the trochanteric fossa, then extended proximally and   distally.  Tag sutures were placed and the retractors were then placed   intracapsular.  We then identified the trochanteric fossa and   orientation of my neck cut, confirmed this radiographically   and then made a neck osteotomy with the femur on traction.  The femoral   head was removed without difficulty or complication.  Traction was let   off and retractors were placed posterior and anterior around the   acetabulum.      The labrum and foveal tissue were debrided.  I began reaming with a 45mm   reamer and reamed up to 49mm reamer with good bony bed preparation and a 50mm   cup was chosen.  The final 50mm Pinnacle  cup was then impacted under fluoroscopy  to confirm the depth of penetration and orientation with respect to   abduction.  A screw was placed followed by the hole eliminator.  The final   32+4 nutral Altrex liner was impacted with good visualized rim fit.  The cup was positioned anatomically within the acetabular portion of the pelvis.      At this point, the femur was rolled at 80 degrees.  Further capsule was   released off the inferior aspect of the femoral neck.  I then   released the superior capsule proximally.  The hook was placed laterally   along the femur and elevated manually and held in position with the bed   hook.  The leg was then extended and adducted with the leg rolled to 100   degrees of external rotation.  Once the proximal femur was fully   exposed, I used a box osteotome to set orientation.  I then began   broaching with the starting chili pepper broach and passed this by hand and then broached up to 7.  With the 7 broach in place I chose a high offset neck and did several trial reductions.  The offset was appropriate, leg lengths   appeared to be equal, confirmed radiographically.   Given these findings, I went ahead and dislocated the hip, repositioned all   retractors and positioned the right hip in the extended and abducted position.  The final 7 Hi Tri Lock stem was   chosen and it was impacted down to the level of neck cut.  Based on this   and the trial reduction, a 32+1 Articuleze metal head ball was chosen and   impacted onto a clean and dry trunnion, and the hip was reduced.  The   hip had been irrigated throughout the case again at this point.  I did   reapproximate the superior capsular leaflet to the anterior leaflet   using #1 Vicryl.  The fascia of the   tensor fascia lata muscle was then reapproximated using #1 Vicryl and #0 V-lock sutures.  The   remaining wound was closed with 2-0 Vicryl and running 4-0 Monocryl.   The hip was cleaned, dried, and  dressed sterilely using Dermabond and  Aquacel dressing.  She was then brought   to recovery room in stable condition tolerating the procedure well.    Skip MayerBlair Roberts, PA-C was present for the entirety of the case involved from   preoperative positioning, perioperative retractor management, general   facilitation of the case, as well as primary wound closure as assistant.            Madlyn FrankelMatthew D. Charlann Boxerlin, M.D.        08/27/2015 12:33 PM

## 2015-08-27 NOTE — Anesthesia Postprocedure Evaluation (Signed)
Anesthesia Post Note  Patient: Miquel DunnLucy L Nicholes  Procedure(s) Performed: Procedure(s) (LRB): LEFT TOTAL HIP ARTHROPLASTY ANTERIOR APPROACH (Left)  Patient location during evaluation: PACU Anesthesia Type: Spinal Level of consciousness: awake and alert Pain management: pain level controlled Vital Signs Assessment: post-procedure vital signs reviewed and stable Respiratory status: spontaneous breathing, nonlabored ventilation, respiratory function stable and patient connected to nasal cannula oxygen Cardiovascular status: blood pressure returned to baseline and stable Postop Assessment: no signs of nausea or vomiting Anesthetic complications: no    Last Vitals:  Filed Vitals:   08/27/15 1329 08/27/15 1330  BP:  118/65  Pulse:  43  Temp:    Resp: 16 19    Last Pain:  Filed Vitals:   08/27/15 1334  PainSc: 5                  Keilly Fatula S

## 2015-08-27 NOTE — Anesthesia Procedure Notes (Signed)
Spinal Patient location during procedure: OR Start time: 08/27/2015 11:17 AM End time: 08/27/2015 11:21 AM Staffing Anesthesiologist: ROSE, Greggory StallionGEORGE Resident/CRNA: Veva HolesSTUBBLEFIELD, Aarib Pulido G Performed by: resident/CRNA  Preanesthetic Checklist Completed: patient identified, site marked, surgical consent, pre-op evaluation, timeout performed, IV checked, risks and benefits discussed and monitors and equipment checked Spinal Block Patient position: sitting Prep: Betadine Patient monitoring: heart rate, continuous pulse ox and blood pressure Approach: midline Location: L2-3 Injection technique: single-shot Needle Needle type: Spinocan  Needle gauge: 22 G Needle insertion depth: 5 cm Assessment Sensory level: T6

## 2015-08-27 NOTE — Discharge Instructions (Signed)

## 2015-08-28 LAB — BASIC METABOLIC PANEL
ANION GAP: 9 (ref 5–15)
BUN: 14 mg/dL (ref 6–20)
CO2: 24 mmol/L (ref 22–32)
Calcium: 8.5 mg/dL — ABNORMAL LOW (ref 8.9–10.3)
Chloride: 105 mmol/L (ref 101–111)
Creatinine, Ser: 0.69 mg/dL (ref 0.44–1.00)
GFR calc non Af Amer: >60 mL/min (ref >60)
GLUCOSE: 136 mg/dL — AB (ref 65–99)
POTASSIUM: 4.4 mmol/L (ref 3.5–5.1)
Sodium: 138 mmol/L (ref 135–145)

## 2015-08-28 LAB — CBC
HEMATOCRIT: 31.7 % — AB (ref 36.0–46.0)
HEMOGLOBIN: 10.6 g/dL — AB (ref 12.0–15.0)
MCH: 31.8 pg (ref 26.0–34.0)
MCHC: 33.4 g/dL (ref 30.0–36.0)
MCV: 95.2 fL (ref 78.0–100.0)
Platelets: 105 10*3/uL — ABNORMAL LOW (ref 150–400)
RBC: 3.33 MIL/uL — AB (ref 3.87–5.11)
RDW: 13.3 % (ref 11.5–15.5)
WBC: 11.2 10*3/uL — AB (ref 4.0–10.5)

## 2015-08-28 MED ORDER — TRAMADOL HCL 50 MG PO TABS
50.0000 mg | ORAL_TABLET | Freq: Four times a day (QID) | ORAL | Status: DC | PRN
  Administered 2015-08-28: 50 mg via ORAL
  Filled 2015-08-28: qty 1
  Filled 2015-08-28: qty 2

## 2015-08-28 MED ORDER — ACETAMINOPHEN 500 MG PO TABS
1000.0000 mg | ORAL_TABLET | Freq: Three times a day (TID) | ORAL | Status: DC
  Administered 2015-08-28 – 2015-08-29 (×2): 1000 mg via ORAL
  Filled 2015-08-28 (×5): qty 2

## 2015-08-28 NOTE — Evaluation (Signed)
Physical Therapy Evaluation Patient Details Name: Kara DunnLucy L Zenner MRN: [id number] DOB: 04/19/1927 Today's Date: 08/28/2015   History of Present Illness  Pt is an 82 year old female s/p L DA THA  Clinical Impression  Pt is s/p L THA resulting in the deficits listed below (see PT Problem List).  Pt will benefit from skilled PT to increase their independence and safety with mobility to allow discharge to the venue listed below.  Pt requiring multimodal cues and pain limiting mobility today.  Pt was able to transfer to recliner with assist.     Follow Up Recommendations SNF;Supervision/Assistance - 24 hour    Equipment Recommendations  Rolling walker with 5" wheels    Recommendations for Other Services       Precautions / Restrictions Precautions Precautions: Fall Restrictions Weight Bearing Restrictions: No Other Position/Activity Restrictions: WBAT      Mobility  Bed Mobility Overal bed mobility: Needs Assistance Bed Mobility: Supine to Sit     Supine to sit: Mod assist     General bed mobility comments: upon arrival pt near EOB supine with R LE over EOB, assist for L LE and trunk upright  Transfers Overall transfer level: Needs assistance Equipment used: Rolling walker (2 wheeled) Transfers: Sit to/from [employer]GI CorporationStand;Stand Pivot Transfers Sit to Stand: Min assist;From elevated surface Stand pivot transfers: Min assist       General transfer comment: verbal cues for safe technique, assist to rise and steady, step by step cues for pivoting, pt with increased pain and felt unable to ambulate at this time  Ambulation/Gait                Stairs            Wheelchair Mobility    Modified Rankin (Stroke Patients Only)       Balance                                             Pertinent Vitals/Pain Pain Assessment: Faces Faces Pain Scale: Hurts whole lot Pain Location: L hip with movement Pain Descriptors / Indicators:  Grimacing;Guarding Pain Intervention(s): Limited activity within patient's tolerance;Monitored during session;Repositioned    Home Living Family/patient expects to be discharged to:: Skilled nursing facility                 Additional Comments: from [facility] with spouse    Prior Function Level of Independence: Independent               Hand Dominance        Extremity/Trunk Assessment   Upper Extremity Assessment: Overall WFL for tasks assessed           Lower Extremity Assessment: Generalized weakness;LLE deficits/detail   LLE Deficits / Details: assist for LE required, able to perform LAQ sitting and ankle pumps     Communication   Communication: No difficulties  Cognition Arousal/Alertness: Awake/alert Behavior During Therapy: WFL for tasks assessed/performed Overall Cognitive Status: No family/caregiver present to determine baseline cognitive functioning       Memory: Decreased short-term memory         General Comments: pt states she woke up confused this morning however better currently, multimodal cues needed to stay on task.  asks repetitive questions    General Comments      Exercises        Assessment/Plan  PT Assessment Patient needs continued PT services  PT Diagnosis Difficulty walking;Acute pain   PT Problem List Decreased strength;Decreased mobility;Decreased knowledge of use of DME;Decreased balance;Pain  PT Treatment Interventions DME instruction;Gait training;Functional mobility training;Patient/family education;Therapeutic activities;Therapeutic exercise;Balance training   PT Goals (Current goals can be found in the Care Plan section) Acute Rehab PT Goals Patient Stated Goal: return to [facility] PT Goal Formulation: With patient Time For Goal Achievement: 09/04/15 Potential to Achieve Goals: Good    Frequency 7X/week   Barriers to discharge        Co-evaluation               End of Session  Equipment Utilized During Treatment: Gait belt Activity Tolerance: Patient limited by pain Patient left: in chair;with call bell/phone within reach;with chair alarm set Nurse Communication: Mobility status         Time: 9811-9147 PT Time Calculation (min) (ACUTE ONLY): 16 min   Charges:   PT Evaluation $PT Eval Low Complexity: 1 Procedure     PT G Codes:        Camiya Vinal,KATHrine E 08/28/2015, 12:46 PM Zenovia Jarred, PT, DPT 08/28/2015 Pager: [phone]

## 2015-08-28 NOTE — Evaluation (Signed)
Occupational Therapy Evaluation Patient Details Name: Kara Lester MRN: [id number] DOB: 05/16/1927 Today's Date: 08/28/2015    History of Present Illness s/p L DA THA   Clinical Impression   This 82 year old female was admitted for the above surgery.  Pt will benefit from skilled OT to increase safety and independence with adls.  Goals in acute are for min guard to min A.  Pt currently needs up to max A for LB dressing    Follow Up Recommendations  SNF    Equipment Recommendations  3 in 1 bedside comode    Recommendations for Other Services       Precautions / Restrictions Precautions Precautions: Fall Restrictions Weight Bearing Restrictions: No      Mobility Bed Mobility               General bed mobility comments: oob  Transfers Overall transfer level: Needs assistance Equipment used: Rolling walker (2 wheeled) Transfers: Sit to/from Stand Sit to Stand: Min assist              Balance                                            ADL Overall ADL's : Needs assistance/impaired     Grooming: Supervision/safety;Set up;Sitting;Wash/dry hands;Wash/dry face   Upper Body Bathing: Supervision/ safety;Sitting   Lower Body Bathing: Moderate assistance;Sit to/from stand   Upper Body Dressing : Minimal assistance;Sitting   Lower Body Dressing: Maximal assistance;Sit to/from stand                 General ADL Comments: performed ADL from recliner.  mod cues to stay on task.  Pt repeatedly washed face; cues to rinse.  Cues to work within pain tolerance.  Pt had difficulty placing call to son:  dialed for her     Vision     Perception     Praxis      Pertinent Vitals/Pain Pain Assessment: Faces Faces Pain Scale: Hurts even more Pain Location: L hip Pain Intervention(s): Limited activity within patient's tolerance;Monitored during session;Premedicated before session;Repositioned;Ice applied     Hand Dominance      Extremity/Trunk Assessment Upper Extremity Assessment Upper Extremity Assessment: Overall WFL for tasks assessed           Communication Communication Communication: No difficulties   Cognition Arousal/Alertness: Awake/alert Behavior During Therapy: WFL for tasks assessed/performed Overall Cognitive Status: No family/caregiver present to determine baseline cognitive functioning                 General Comments: multimodal cues needed to stay on task.  asks the same questions repeatedly   General Comments       Exercises       Shoulder Instructions      Home Living Family/patient expects to be discharged to::  (from [facility]; plans rehab there)                                        Prior Functioning/Environment Level of Independence: Independent             OT Diagnosis: Acute pain   OT Problem List: Decreased strength;Decreased activity tolerance;Decreased cognition;Decreased knowledge of use of DME or AE;Pain   OT Treatment/Interventions: Self-care/ADL training;DME and/or AE instruction;Patient/family  education;Cognitive remediation/compensation    OT Goals(Current goals can be found in the care plan section) Acute Rehab OT Goals Patient Stated Goal: return to [facility] OT Goal Formulation: With patient Time For Goal Achievement: 09/04/15 Potential to Achieve Goals: Good ADL Goals Pt Will Perform Grooming: standing;with min guard assist Pt Will Perform Lower Body Bathing: with min assist;sit to/from stand;with adaptive equipment Pt Will Perform Lower Body Dressing: with min assist;with adaptive equipment;sit to/from stand Pt Will Transfer to Toilet: with min guard assist;ambulating;bedside commode Pt Will Perform Toileting - Clothing Manipulation and hygiene: with min guard assist;sit to/from stand Additional ADL Goal #1: pt will complete adl with no more 1 cue for attention to task  OT Frequency: Min 2X/week   Barriers to  D/C:            Co-evaluation              End of Session    Activity Tolerance: Patient tolerated treatment well Patient left: in chair;with call bell/phone within reach;with chair alarm set   Time: 1610-9604 OT Time Calculation (min): 23 min Charges:  OT General Charges $OT Visit: 1 Procedure OT Evaluation $OT Eval Low Complexity: 1 Procedure G-Codes:    Alek Borges Sep 20, 2015, 12:19 PM  Marica Otter, OTR/L [phone] 09/20/15

## 2015-08-28 NOTE — Progress Notes (Signed)
Utilization review completed.  

## 2015-08-28 NOTE — Progress Notes (Signed)
Physical Therapy Treatment Note    08/28/15 1500  PT Visit Information  Last PT Received On 08/28/15  Assistance Needed +1  History of Present Illness Pt is an 82 year old female s/p L DA THA  PT Time Calculation  PT Start Time (ACUTE ONLY) 1343  PT Stop Time (ACUTE ONLY) 1411  PT Time Calculation (min) (ACUTE ONLY) 28 min  Subjective Data  Subjective Pt assisted to/from bathroom and requiring multimodal cues and increased time.  Precautions  Precautions Fall  Restrictions  Other Position/Activity Restrictions WBAT  Pain Assessment  Pain Assessment Faces  Faces Pain Scale 6  Pain Location L hip with movement  Pain Descriptors / Indicators Discomfort;Sore  Pain Intervention(s) Limited activity within patient's tolerance;Monitored during session;Repositioned  Cognition  Arousal/Alertness Awake/alert  Behavior During Therapy WFL for tasks assessed/performed  Overall Cognitive Status No family/caregiver present to determine baseline cognitive functioning  Memory Decreased short-term memory  General Comments requiring multimodal cues and constant cues  Bed Mobility  Overal bed mobility Needs Assistance  Bed Mobility Sit to Supine  Sit to supine Mod assist  General bed mobility comments assist for LEs onto bed  Transfers  Overall transfer level Needs assistance  Equipment used Rolling walker (2 wheeled)  Transfers Sit to/from Stand  Sit to Stand Min assist  General transfer comment verbal cues for safe technique, assist with rise and descent  Ambulation/Gait  Ambulation/Gait assistance Min assist;Mod assist  Ambulation Distance (Feet) 16 Feet (total)  Assistive device Rolling walker (2 wheeled)  Gait Pattern/deviations Step-to pattern;Antalgic  General Gait Details multimodal cues for sequence, RW positioning, step length, posture  PT - End of Session  Equipment Utilized During Treatment Gait belt  Activity Tolerance Patient limited by pain  Patient left in bed;with call  bell/phone within reach;with bed alarm set  PT - Assessment/Plan  PT Plan Current plan remains appropriate  PT Frequency (ACUTE ONLY) 7X/week  Follow Up Recommendations SNF;Supervision/Assistance - 24 hour  PT equipment Rolling walker with 5" wheels  PT Goal Progression  Progress towards PT goals Progressing toward goals  PT General Charges  $$ ACUTE PT VISIT 1 Procedure  PT Treatments  $Gait Training 8-22 mins  $Therapeutic Activity 8-22 mins    Zenovia JarredKati Helina Hullum, PT, DPT 08/28/2015 Pager: [phone]

## 2015-08-28 NOTE — NC FL2 (Signed)
[place] MEDICAID FL2 LEVEL OF CARE SCREENING TOOL     IDENTIFICATION  Patient Name: Kara DunnLucy L Lester Birthdate: 10/05/1926 Sex: female Admission Date (Current Location): 08/27/2015  [facility] [place]   Facility and Address:  [facility],  [address], [place] [id number]      Provider Number: [id number]  Attending Physician Name and Address:  Durene RomansMatthew Olin, MD  Relative Name and Phone Number:       Current Level of Care: Hospital Recommended Level of Care: Skilled Nursing Facility Prior Approval Number:    Date Approved/Denied:   PASRR Number: [id number]  Discharge Plan: SNF    Current Diagnoses: Patient Active Problem List   Diagnosis Date Noted  . S/P left THA, AA 08/27/2015    Orientation RESPIRATION BLADDER Height & Weight     Self, Time, Situation, Place  Normal Indwelling catheter Weight: 66.792 kg (147 lb 4 oz) Height:  5\' 3"  (160 cm)  BEHAVIORAL SYMPTOMS/MOOD NEUROLOGICAL BOWEL NUTRITION STATUS  Other (Comment) (No Behaviors)   Continent Diet  AMBULATORY STATUS COMMUNICATION OF NEEDS Skin   Limited Assist Verbally Normal                       Personal Care Assistance Level of Assistance  Bathing, Feeding, Dressing Bathing Assistance: Limited assistance Feeding assistance: Independent Dressing Assistance: Limited assistance     Functional Limitations Info  Sight, Hearing, Speech Sight Info: Adequate Hearing Info: Adequate Speech Info: Adequate    SPECIAL CARE FACTORS FREQUENCY  PT (By licensed PT), OT (By licensed OT)     PT Frequency: 5 x wk OT Frequency: 5 x wk            Contractures Contractures Info: Not present    Additional Factors Info    Code Status Info: Full Code             Current Medications (08/28/2015):  This is the current hospital active medication list Current Facility-Administered Medications  Medication Dose Route Frequency Provider Last Rate Last Dose  . 0.9 %  sodium  chloride infusion   Intravenous Continuous Lanney GinsMatthew Babish, PA-C 100 mL/hr at 08/28/15 0523    . alum & mag hydroxide-simeth (MAALOX/MYLANTA) 200-200-20 MG/5ML suspension 30 mL  30 mL Oral Q4H PRN Lanney GinsMatthew Babish, PA-C      . aspirin EC tablet 325 mg  325 mg Oral BID Lanney GinsMatthew Babish, PA-C   325 mg at 08/28/15 0831  . [START ON 08/29/2015] atenolol (TENORMIN) tablet 100 mg  100 mg Oral Hulan FessQODAY Matthew Olin, MD      . atenolol (TENORMIN) tablet 50 mg  50 mg Oral QODAY Lanney GinsMatthew Babish, PA-C      . atorvastatin (LIPITOR) tablet 10 mg  10 mg Oral QPM Lanney GinsMatthew Babish, PA-C   10 mg at 08/27/15 1827  . bisacodyl (DULCOLAX) suppository 10 mg  10 mg Rectal Daily PRN Lanney GinsMatthew Babish, PA-C      . celecoxib (CELEBREX) capsule 200 mg  200 mg Oral Q12H Lanney GinsMatthew Babish, PA-C   200 mg at 08/27/15 2341  . diphenhydrAMINE (BENADRYL) capsule 25 mg  25 mg Oral Q6H PRN Lanney GinsMatthew Babish, PA-C      . docusate sodium (COLACE) capsule 100 mg  100 mg Oral BID Lanney GinsMatthew Babish, PA-C   100 mg at 08/27/15 2344  . ferrous sulfate tablet 325 mg  325 mg Oral TID PC Lanney GinsMatthew Babish, PA-C   325 mg at 08/28/15 [id number]  . gabapentin (NEURONTIN)  capsule 300 mg  300 mg Oral TID Lanney Gins, PA-C   300 mg at 08/27/15 2341  . HYDROcodone-acetaminophen (NORCO) 7.5-325 MG per tablet 1-2 tablet  1-2 tablet Oral Q4H Lanney Gins, PA-C   1 tablet at 08/28/15 [phone]  . HYDROmorphone (DILAUDID) injection 0.5-1 mg  0.5-1 mg Intravenous Q2H PRN Lanney Gins, PA-C      . latanoprost (XALATAN) 0.005 % ophthalmic solution 1 drop  1 drop Both Eyes QHS Lanney Gins, PA-C   1 drop at 08/27/15 2342  . magnesium citrate solution 1 Bottle  1 Bottle Oral Once PRN Lanney Gins, PA-C      . menthol-cetylpyridinium (CEPACOL) lozenge 3 mg  1 lozenge Oral PRN Lanney Gins, PA-C       Or  . phenol (CHLORASEPTIC) mouth spray 1 spray  1 spray Mouth/Throat PRN Lanney Gins, PA-C      . methocarbamol (ROBAXIN) tablet 500 mg  500 mg Oral Q6H PRN Lanney Gins, PA-C        Or  . methocarbamol (ROBAXIN) 500 mg in dextrose 5 % 50 mL IVPB  500 mg Intravenous Q6H PRN Lanney Gins, PA-C   500 mg at 08/27/15 1440  . metoCLOPramide (REGLAN) tablet 5-10 mg  5-10 mg Oral Q8H PRN Lanney Gins, PA-C       Or  . metoCLOPramide (REGLAN) injection 5-10 mg  5-10 mg Intravenous Q8H PRN Lanney Gins, PA-C      . ondansetron [facility]) tablet 4 mg  4 mg Oral Q6H PRN Lanney Gins, PA-C       Or  . ondansetron [facility]) injection 4 mg  4 mg Intravenous Q6H PRN Lanney Gins, PA-C      . polyethylene glycol (MIRALAX / GLYCOLAX) packet 17 g  17 g Oral BID Lanney Gins, PA-C   17 g at 08/27/15 2343     Discharge Medications: Please see discharge summary for a list of discharge medications.  Relevant Imaging Results:  Relevant Lab Results:   Additional Information SS # [id number]  Egon Dittus, Dickey Gave, LCSW

## 2015-08-28 NOTE — Progress Notes (Addendum)
     Subjective: 1 Day Post-Op Procedure(s) (LRB): LEFT TOTAL HIP ARTHROPLASTY ANTERIOR APPROACH (Left)   Seen by Dr. Charlann Boxerlin. Patient reports pain as moderate, increase in pain after therapy.  No events throughout the night.  Planning on discharged to SNF of the facility where she lives.  Objective:   VITALS:   Filed Vitals:   08/28/15 0200 08/28/15 0635  BP: 113/97 127/90  Pulse: 72 62  Temp:  98 F (36.7 C)  Resp: 18 18    Dorsiflexion/Plantar flexion intact Incision: dressing C/D/I No cellulitis present Compartment soft  LABS  Recent Labs  08/28/15 0407  HGB 10.6*  HCT 31.7*  WBC 11.2*  PLT 105*     Recent Labs  08/28/15 0407  NA 138  K 4.4  BUN 14  CREATININE 0.69  GLUCOSE 136*     Assessment/Plan: 1 Day Post-Op Procedure(s) (LRB): LEFT TOTAL HIP ARTHROPLASTY ANTERIOR APPROACH (Left) Foley cath d/c'ed Advance diet Up with therapy D/C IV fluids Discharge to SNF eventually, when ready   Overweight (BMI 25-29.9) Estimated body mass index is 26.09 kg/(m^2) as calculated from the following:   Height as of this encounter: 5\' 3"  (1.6 m).   Weight as of this encounter: 66.792 kg (147 lb 4 oz). Patient also counseled that weight may inhibit the healing process Patient counseled that losing weight will help with future health issues       Anastasio AuerbachMatthew S. Kortney Schoenfelder   PAC  08/28/2015, 9:39 AM

## 2015-08-28 NOTE — Clinical Social Work Note (Signed)
Clinical Social Work Assessment  Patient Details  Name: Kara Lester MRN: [id number] Date of Birth: 1926/08/09  Date of referral:  08/28/15               Reason for consult:  Discharge Planning                Permission sought to share information with:  Facility Art therapist granted to share information::  Yes, Verbal Permission Granted  Name::        Agency::     Relationship::     Contact Information:     Housing/Transportation Living arrangements for the past 2 months:  Apartment Source of Information:  Patient Patient Interpreter Needed:  None Criminal Activity/Legal Involvement Pertinent to Current Situation/Hospitalization:  No - Comment as needed Significant Relationships:  Spouse Lives with:  Spouse Do you feel safe going back to the place where you live?   (ST Rehab needed.) Need for family participation in patient care:  No (Coment)  Care giving concerns:  Pt's care cannot be managed at [place] apt following hospital d/c.   Social Worker assessment / plan:  Pt hospitalized on 08/27/15 for pre planned left total hip arthroplasty. CSW met with pt to assist with d/c planning. Pt reports that she has made prior arrangements to have rehab at [facility] at d/c. Pt is from their [place]. CSW has contacted SNF and d/c plan has been confirmed pending PT recommendations. CSW will continue to follow to assist with d/c planning needs. Employment status:  Retired Nurse, adult PT Recommendations:  Not assessed at this time Information / Referral to community resources:     Patient/Family's Response to care:  Pt feels ST Rehab is needed.  Patient/Family's Understanding of and Emotional Response to Diagnosis, Current Treatment, and Prognosis:  Pt is aware of her medical status. Pt is relieved surgery is over and all went well. " I'm not feeling great. I'll feel better by tomorrow, I expect. " Support /  reassurance provided.  Emotional Assessment Appearance:  Appears stated age Attitude/Demeanor/Rapport:  Other (cooperative) Affect (typically observed):  Calm, Appropriate, Pleasant Orientation:  Oriented to Self, Oriented to Place, Oriented to  Time, Oriented to Situation Alcohol / Substance use:  Not Applicable Psych involvement (Current and /or in the community):  No (Comment)  Discharge Needs  Concerns to be addressed:  Discharge Planning Concerns Readmission within the last 30 days:  No Current discharge risk:  None Barriers to Discharge:  No Barriers Identified   [name], [place] 08/28/2015, 11:22 AM

## 2015-08-29 DIAGNOSIS — E663 Overweight: Secondary | ICD-10-CM | POA: Diagnosis present

## 2015-08-29 LAB — BASIC METABOLIC PANEL
Anion gap: 6 (ref 5–15)
BUN: 22 mg/dL — AB (ref 6–20)
CHLORIDE: 112 mmol/L — AB (ref 101–111)
CO2: 26 mmol/L (ref 22–32)
CREATININE: 0.69 mg/dL (ref 0.44–1.00)
Calcium: 8.9 mg/dL (ref 8.9–10.3)
GFR calc Af Amer: >60 mL/min (ref >60)
GFR calc non Af Amer: >60 mL/min (ref >60)
GLUCOSE: 119 mg/dL — AB (ref 65–99)
POTASSIUM: 5.1 mmol/L (ref 3.5–5.1)
SODIUM: 144 mmol/L (ref 135–145)

## 2015-08-29 LAB — CBC
HCT: 28.6 % — ABNORMAL LOW (ref 36.0–46.0)
HEMOGLOBIN: 9.4 g/dL — AB (ref 12.0–15.0)
MCH: 31.8 pg (ref 26.0–34.0)
MCHC: 32.9 g/dL (ref 30.0–36.0)
MCV: 96.6 fL (ref 78.0–100.0)
PLATELETS: 94 10*3/uL — AB (ref 150–400)
RBC: 2.96 MIL/uL — AB (ref 3.87–5.11)
RDW: 13.7 % (ref 11.5–15.5)
WBC: 14 10*3/uL — ABNORMAL HIGH (ref 4.0–10.5)

## 2015-08-29 MED ORDER — TRAMADOL HCL 50 MG PO TABS: 50.0000 mg | ORAL_TABLET | Freq: Four times a day (QID) | ORAL | Status: AC | PRN

## 2015-08-29 MED ORDER — DOCUSATE SODIUM 100 MG PO CAPS: 100.0000 mg | ORAL_CAPSULE | Freq: Two times a day (BID) | ORAL | Status: AC

## 2015-08-29 MED ORDER — FERROUS SULFATE 325 (65 FE) MG PO TABS: 325.0000 mg | ORAL_TABLET | Freq: Three times a day (TID) | ORAL | Status: AC

## 2015-08-29 MED ORDER — ACETAMINOPHEN 500 MG PO TABS: 1000.0000 mg | ORAL_TABLET | Freq: Three times a day (TID) | ORAL | Status: AC

## 2015-08-29 MED ORDER — ASPIRIN 325 MG PO TBEC: 325.0000 mg | DELAYED_RELEASE_TABLET | Freq: Two times a day (BID) | ORAL | Status: AC

## 2015-08-29 MED ORDER — POLYETHYLENE GLYCOL 3350 17 G PO PACK: 17.0000 g | PACK | Freq: Two times a day (BID) | ORAL | Status: AC

## 2015-08-29 NOTE — Care Management Note (Signed)
Case Management Note  Patient Details  Name: Kara Lester MRN: [id number] Date of Birth: 01/07/1927  Subjective/Objective:                  LEFT TOTAL HIP ARTHROPLASTY ANTERIOR APPROACH (Left) Action/Plan: discharge planning Expected Discharge Date:                  Expected Discharge Plan:  Skilled Nursing Facility  In-House Referral:     Discharge planning Services  CM Consult  Post Acute Care Choice:    Choice offered to:  Patient  DME Arranged:    DME Agency:     HH Arranged:    HH Agency:     Status of Service:  Completed, signed off  Medicare Important Message Given:    Date Medicare IM Given:    Medicare IM give by:    Date Additional Medicare IM Given:    Additional Medicare Important Message give by:     If discussed at Long Length of Stay Meetings, dates discussed:    Additional Comments: Cm notes plan is for pt to go to SNF; CSW arranging.  No other CM needs were communicated. Yves DillJeffries, Andy Moye Christine, RN 08/29/2015, 12:11 PM

## 2015-08-29 NOTE — Progress Notes (Signed)
Pt / spouse are in agreement with d/c to [facility] today. Pt transported by family. D/C Summary sent to SNF for review prior to d/c. Scripts included in d/c packet. D/C packet provided to pt prior to d/c. # for report provided to nsg.  Cori RazorJamie Carolynn Tuley LCSW [phone]

## 2015-08-29 NOTE — Discharge Summary (Signed)
Physician Discharge Summary  Patient ID: Kara DunnLucy L Lester MRN: [id number] DOB/AGE: 80/02/1927 80 y.o.  Admit date: 08/27/2015 Discharge date:  3/9/217  Procedures:  Procedure(s) (LRB): LEFT TOTAL HIP ARTHROPLASTY ANTERIOR APPROACH (Left)  Attending Physician:  Dr. Durene RomansMatthew Olin   Admission Diagnoses:   Left hip primary OA / pain  Discharge Diagnoses:  Principal Problem:   S/P left THA, AA Active Problems:   Overweight (BMI 25.0-29.9)  Past Medical History  Diagnosis Date  . Hypertension   . Heart rate slow     history of  . Aneurysm (HCC)     cerebral" no surgical repair"- frquent headaches . or lessening-Dr. [phone],neurology  . Hypercholesteremia   . Hypothyroidism     childhood years- no issues  . GERD (gastroesophageal reflux disease)   . Arthritis     Osteoarthritis- "spinal stenosis"- hip and back pain.    HPI:    Kara DunnLucy L Lester, 82 y.o. female, has a history of pain and functional disability in the left hip(s) due to arthritis and patient has failed non-surgical conservative treatments for greater than 12 weeks to include NSAID's and/or analgesics, corticosteriod injections, use of assistive devices and activity modification. Onset of symptoms was gradual starting 2+ years ago with gradually worsening course since that time.The patient noted no past surgery on the left hip(s). Patient currently rates pain in the left hip at 8 out of 10 with activity. Patient has night pain, worsening of pain with activity and weight bearing, trendelenberg gait, pain that interfers with activities of daily living and pain with passive range of motion. Patient has evidence of periarticular osteophytes and joint space narrowing by imaging studies. This condition presents safety issues increasing the risk of falls. There is no current active infection. Risks, benefits and expectations were discussed with the patient. Risks including but not limited to the risk of anesthesia, blood  clots, nerve damage, blood vessel damage, failure of the prosthesis, infection and up to and including death. Patient understand the risks, benefits and expectations and wishes to proceed with surgery.   PCP: Nadara EatonPIAZZA, MICHAEL J, MD   Discharged Condition: good  Hospital Course:  Patient underwent the above stated procedure on 08/27/2015. Patient tolerated the procedure well and brought to the recovery room in good condition and subsequently to the floor.  POD #1 BP: 127/90 ; Pulse: 62 ; Temp: 98 F (36.7 C) ; Resp: 18 Patient reports pain as moderate, increase in pain after therapy. No events throughout the night. Planning on discharged to SNF of the facility where she lives. Dorsiflexion/plantar flexion intact, incision: dressing C/D/I, no cellulitis present and compartment soft.   LABS  Basename    HGB     10.6  HCT     31.7   POD #2  BP: 139/68 ; Pulse: 71 ; Temp: 97.9 F (36.6 C) ; Resp: 16 Patient reports pain as mild, pain controlled. Feels that the pain is much better today. No events throughout the night. Ready to be discharged to skilled nursing facility. Dorsiflexion/plantar flexion intact, incision: dressing C/D/I, no cellulitis present and compartment soft.   LABS  Basename    HGB     9.4  HCT     28.6    Discharge Exam: General appearance: alert, cooperative and no distress Extremities: Homans sign is negative, no sign of DVT, no edema, redness or tenderness in the calves or thighs and no ulcers, gangrene or trophic changes  Disposition:    Skilled nursing facility  with  follow up in 2 weeks   Follow-up Information    Follow up with Shelda Pal, MD. Schedule an appointment as soon as possible for a visit in 2 weeks.   Specialty:  Orthopedic Surgery   Contact information:   [address] [place] [address] [phone]           Discharge Instructions    Call MD / Call 911    Complete by:  As directed   If you experience chest  pain or shortness of breath, CALL 911 and be transported to the hospital emergency room.  If you develope a fever above 101 F, pus (white drainage) or increased drainage or redness at the wound, or calf pain, call your surgeon's office.     Change dressing    Complete by:  As directed   Maintain surgical dressing until follow up in the clinic. If the edges start to pull up, may reinforce with tape. If the dressing is no longer working, may remove and cover with gauze and tape, but must keep the area dry and clean.  Call with any questions or concerns.     Constipation Prevention    Complete by:  As directed   Drink plenty of fluids.  Prune juice may be helpful.  You may use a stool softener, such as Colace (over the counter) 100 mg twice a day.  Use MiraLax (over the counter) for constipation as needed.     Diet - low sodium heart healthy    Complete by:  As directed      Discharge instructions    Complete by:  As directed   Maintain surgical dressing until follow up in the clinic. If the edges start to pull up, may reinforce with tape. If the dressing is no longer working, may remove and cover with gauze and tape, but must keep the area dry and clean.  Follow up in 2 weeks at [facility]. Call with any questions or concerns.     Increase activity slowly as tolerated    Complete by:  As directed   Weight bearing as tolerated with assist device (walker, cane, etc) as directed, use it as long as suggested by your surgeon or therapist, typically at least 4-6 weeks.     TED hose    Complete by:  As directed   Use stockings (TED hose) for 2 weeks on both leg(s).  You may remove them at night for sleeping.             Medication List    TAKE these medications        acetaminophen 500 MG tablet  Commonly known as:  TYLENOL  Take 2 tablets (1,000 mg total) by mouth every 8 (eight) hours.     aspirin 325 MG EC tablet  Take 1 tablet (325 mg total) by mouth 2 (two) times daily.       atenolol 50 MG tablet  Commonly known as:  TENORMIN  Take 50-100 mg by mouth See admin instructions. Alternates between  daily and  daily.     atorvastatin 10 MG tablet  Commonly known as:  LIPITOR  Take 10 mg by mouth every evening.     bimatoprost 0.01 % Soln  Commonly known as:  LUMIGAN  Place 1 drop into both eyes at bedtime.     Diphenhydramine-APAP 25-500 MG Tabs  Take 1 tablet by mouth at bedtime as needed (Sleep).     docusate sodium 100 MG  capsule  Commonly known as:  COLACE  Take 1 capsule (100 mg total) by mouth 2 (two) times daily.     ferrous sulfate 325 (65 FE) MG tablet  Take 1 tablet (325 mg total) by mouth 3 (three) times daily after meals.     gabapentin 300 MG capsule  Commonly known as:  NEURONTIN  Take 300 mg by mouth 3 (three) times daily.     polyethylene glycol packet  Commonly known as:  MIRALAX / GLYCOLAX  Take 17 g by mouth 2 (two) times daily.     traMADol 50 MG tablet  Commonly known as:  ULTRAM  Take 1-2 tablets (50-100 mg total) by mouth every 6 (six) hours as needed for moderate pain or severe pain.         Signed: Anastasio Auerbach. Bayani Renteria   PA-C  08/29/2015, 1:00 PM

## 2015-08-29 NOTE — Plan of Care (Signed)
Problem: Safety: Goal: Ability to remain free from injury will improve Outcome: Adequate for Discharge To SNF

## 2015-08-29 NOTE — Progress Notes (Signed)
     Subjective: 2 Days Post-Op Procedure(s) (LRB): LEFT TOTAL HIP ARTHROPLASTY ANTERIOR APPROACH (Left)   Patient reports pain as mild, pain controlled.  Feels that the pain is much better today.   No events throughout the night.  Ready to be discharged to skilled nursing facility.  Objective:   VITALS:   Filed Vitals:   08/28/15 2041 08/29/15 0500  BP: 123/60 139/68  Pulse: 64 71  Temp: 98.2 F (36.8 C) 97.9 F (36.6 C)  Resp: 16 16    Dorsiflexion/Plantar flexion intact Incision: dressing C/D/I No cellulitis present Compartment soft  LABS  Recent Labs  08/28/15 0407 08/29/15 0347  HGB 10.6* 9.4*  HCT 31.7* 28.6*  WBC 11.2* 14.0*  PLT 105* 94*     Recent Labs  08/28/15 0407 08/29/15 0347  NA 138 144  K 4.4 5.1  BUN 14 22*  CREATININE 0.69 0.69  GLUCOSE 136* 119*     Assessment/Plan: 2 Days Post-Op Procedure(s) (LRB): LEFT TOTAL HIP ARTHROPLASTY ANTERIOR APPROACH (Left) Up with therapy Discharge to SNF  [facility]. Follow up with OLIN,Azha Constantin D in 2 weeks.  Contact information:  [facility] [address] [place] [id number] [phone]     Overweight (BMI 25-29.9) Estimated body mass index is 26.09 kg/(m^2) as calculated from the following:   Height as of this encounter: 5\' 3"  (1.6 m).   Weight as of this encounter: 66.792 kg (147 lb 4 oz). Patient also counseled that weight may inhibit the healing process Patient counseled that losing weight will help with future health issues         Anastasio AuerbachMatthew S. Deklin Bieler   PAC  08/29/2015, 12:43 PM

## 2015-08-29 NOTE — Progress Notes (Signed)
Discharged from floor via w/c, spouse & belongings with pt. [facility]. No changes in assessment. Trinton Prewitt, [employer]

## 2015-08-29 NOTE — Progress Notes (Signed)
Physical Therapy Treatment Patient Details Name: Kara DunnLucy L Friesenhahn MRN: [id number] DOB: 08/19/1926 Today's Date: 08/29/2015    History of Present Illness Pt is an 82 year old female s/p L DA THA    PT Comments    Pt assisted with ambulating in hallway and reports UE fatigue and L hip pain however pt reminded she had hip surgery and to perform to tolerance.  Pt requiring constant cues for technique and safety.  Follow Up Recommendations  SNF;Supervision/Assistance - 24 hour     Equipment Recommendations  Rolling walker with 5" wheels    Recommendations for Other Services       Precautions / Restrictions Precautions Precautions: Fall Restrictions Other Position/Activity Restrictions: WBAT    Mobility  Bed Mobility Overal bed mobility: Needs Assistance Bed Mobility: Supine to Sit     Supine to sit: Min assist     General bed mobility comments: assist for L LE  Transfers Overall transfer level: Needs assistance Equipment used: Rolling walker (2 wheeled) Transfers: Sit to/from Stand Sit to Stand: Min assist         General transfer comment: verbal cues for safe technique, assist with rise and descent  Ambulation/Gait Ambulation/Gait assistance: Min assist Ambulation Distance (Feet): 80 Feet Assistive device: Rolling walker (2 wheeled) Gait Pattern/deviations: Step-to pattern;Antalgic;Trunk flexed     General Gait Details: multimodal cues for sequence, RW positioning, step length, posture   Stairs            Wheelchair Mobility    Modified Rankin (Stroke Patients Only)       Balance                                    Cognition Arousal/Alertness: Awake/alert Behavior During Therapy: WFL for tasks assessed/performed         Memory: Decreased short-term memory         General Comments: requiring multimodal cues and constant cues    Exercises Total Joint Exercises Ankle Circles/Pumps: Both;10 reps;AROM Quad Sets:  AROM;Both;10 reps Heel Slides: AAROM;Left;10 reps Hip ABduction/ADduction: AAROM;Left;10 reps    General Comments        Pertinent Vitals/Pain Pain Assessment: Faces Faces Pain Scale: Hurts little more Pain Location: L hip with movement Pain Descriptors / Indicators: Sore;Aching;Tender Pain Intervention(s): Repositioned;Ice applied;Monitored during session;Limited activity within patient's tolerance    Home Living                      Prior Function            PT Goals (current goals can now be found in the care plan section) Progress towards PT goals: Progressing toward goals    Frequency  7X/week    PT Plan Current plan remains appropriate    Co-evaluation             End of Session Equipment Utilized During Treatment: Gait belt Activity Tolerance: Patient limited by pain Patient left: with call bell/phone within reach;in chair;with chair alarm set     Time: 0937-1000 PT Time Calculation (min) (ACUTE ONLY): 23 min  Charges:  $Gait Training: 23-37 mins                    G Codes:      Staisha Winiarski,KATHrine E 08/29/2015, 3:27 PM Zenovia JarredKati Ariaunna Longsworth, PT, DPT 08/29/2015 Pager: [phone]

## 2016-06-26 ENCOUNTER — Other Ambulatory Visit (HOSPITAL_COMMUNITY): Payer: Self-pay | Admitting: Interventional Radiology

## 2016-06-26 DIAGNOSIS — I671 Cerebral aneurysm, nonruptured: Secondary | ICD-10-CM

## 2016-06-30 ENCOUNTER — Encounter (HOSPITAL_COMMUNITY): Payer: Self-pay | Admitting: Radiology

## 2016-06-30 ENCOUNTER — Ambulatory Visit (HOSPITAL_COMMUNITY)
Admission: RE | Admit: 2016-06-30 | Discharge: 2016-06-30 | Disposition: A | Discharge status: Home / Self Care | Payer: Medicare Other | Source: Ambulatory Visit | Attending: Interventional Radiology | Admitting: Interventional Radiology

## 2016-06-30 DIAGNOSIS — I671 Cerebral aneurysm, nonruptured: Secondary | ICD-10-CM

## 2016-06-30 HISTORY — PX: IR GENERIC HISTORICAL: IMG1180011

## 2016-07-06 ENCOUNTER — Other Ambulatory Visit (HOSPITAL_COMMUNITY): Payer: Self-pay | Admitting: Interventional Radiology

## 2016-07-06 ENCOUNTER — Encounter (HOSPITAL_COMMUNITY): Payer: Self-pay | Admitting: Interventional Radiology

## 2016-07-06 DIAGNOSIS — I729 Aneurysm of unspecified site: Secondary | ICD-10-CM

## 2016-07-14 ENCOUNTER — Ambulatory Visit (HOSPITAL_COMMUNITY)
Admission: RE | Admit: 2016-07-14 | Discharge: 2016-07-14 | Disposition: A | Discharge status: Home / Self Care | Payer: Medicare Other | Source: Ambulatory Visit | Attending: Interventional Radiology | Admitting: Interventional Radiology

## 2016-07-14 DIAGNOSIS — I729 Aneurysm of unspecified site: Secondary | ICD-10-CM | POA: Diagnosis not present

## 2016-07-14 DIAGNOSIS — I6782 Cerebral ischemia: Secondary | ICD-10-CM | POA: Insufficient documentation

## 2016-07-14 LAB — CREATININE, SERUM
CREATININE: 0.94 mg/dL (ref 0.44–1.00)
GFR calc Af Amer: >60 mL/min (ref >60)
GFR, EST NON AFRICAN AMERICAN: 53 mL/min — AB (ref >60)

## 2016-07-14 MED ORDER — GADOBENATE DIMEGLUMINE 529 MG/ML IV SOLN
15.0000 mL | Freq: Once | INTRAVENOUS | Status: AC | PRN
  Administered 2016-07-14: 15 mL via INTRAVENOUS

## 2016-07-23 ENCOUNTER — Other Ambulatory Visit (HOSPITAL_COMMUNITY): Payer: Self-pay | Admitting: Interventional Radiology

## 2016-07-23 DIAGNOSIS — I729 Aneurysm of unspecified site: Secondary | ICD-10-CM

## 2016-07-30 ENCOUNTER — Ambulatory Visit (HOSPITAL_COMMUNITY)
Admission: RE | Admit: 2016-07-30 | Discharge: 2016-07-30 | Disposition: A | Discharge status: Home / Self Care | Payer: Medicare Other | Source: Ambulatory Visit | Attending: Interventional Radiology | Admitting: Interventional Radiology

## 2016-07-30 ENCOUNTER — Encounter (HOSPITAL_COMMUNITY): Payer: Self-pay | Admitting: Radiology

## 2016-07-30 DIAGNOSIS — I729 Aneurysm of unspecified site: Secondary | ICD-10-CM

## 2016-07-30 HISTORY — PX: IR GENERIC HISTORICAL: IMG1180011

## 2016-08-11 ENCOUNTER — Encounter (HOSPITAL_COMMUNITY): Payer: Self-pay | Admitting: Interventional Radiology

## 2016-08-17 ENCOUNTER — Other Ambulatory Visit (HOSPITAL_COMMUNITY): Payer: Self-pay | Admitting: Interventional Radiology

## 2016-08-17 DIAGNOSIS — I671 Cerebral aneurysm, nonruptured: Secondary | ICD-10-CM

## 2016-08-19 ENCOUNTER — Other Ambulatory Visit: Payer: Self-pay | Admitting: Radiology

## 2016-08-28 NOTE — Pre-Procedure Instructions (Signed)
    Kara DunnLucy L Lester  08/28/2016      [facility] [place], [place] - [address] [place] [id number] Phone: [phone] Fax: [phone]    Your procedure is scheduled on Wednesday March 14.  [facility] Ad[facility] at 7:00 A.M.  Call this number if you have problems the morning of surgery:  [phone]   Remember:  Do not eat food or drink liquids after midnight.  Take these medicines the morning of surgery with A SIP OF WATER: atenolol (tenormin), aspirin, gabapentin (neurontin), omeprazole (prilosec), tramadol (ultram) if needed  7 days prior to surgery STOP taking any Aleve, Naproxen, Ibuprofen, Motrin, Advil, Goody's, BC's, all herbal medications, fish oil, and all vitamins    Do not wear jewelry, make-up or nail polish.  Do not wear lotions, powders, or perfumes, or deoderant.  Do not shave 48 hours prior to surgery.  Men may shave face and neck.  Do not bring valuables to the hospital.  [facility] is not responsible for any belongings or valuables.  Contacts, dentures or bridgework may not be worn into surgery.  Leave your suitcase in the car.  After surgery it may be brought to your room.  For patients admitted to the hospital, discharge time will be determined by your treatment team.  Patients discharged the day of surgery will not be allowed to drive home.

## 2016-08-31 ENCOUNTER — Encounter (HOSPITAL_COMMUNITY)
Admission: RE | Admit: 2016-08-31 | Discharge: 2016-08-31 | Disposition: A | Discharge status: Home / Self Care | Payer: Medicare Other | Source: Ambulatory Visit | Attending: Interventional Radiology | Admitting: Interventional Radiology

## 2016-08-31 ENCOUNTER — Ambulatory Visit (HOSPITAL_COMMUNITY): Payer: Medicare Other | Admitting: Certified Registered Nurse Anesthetist

## 2016-08-31 ENCOUNTER — Ambulatory Visit (HOSPITAL_COMMUNITY): Payer: Medicare Other | Admitting: Vascular Surgery

## 2016-08-31 ENCOUNTER — Encounter (HOSPITAL_COMMUNITY): Payer: Self-pay

## 2016-08-31 DIAGNOSIS — Z01818 Encounter for other preprocedural examination: Secondary | ICD-10-CM | POA: Diagnosis not present

## 2016-08-31 DIAGNOSIS — Z79899 Other long term (current) drug therapy: Secondary | ICD-10-CM | POA: Diagnosis not present

## 2016-08-31 DIAGNOSIS — Z87891 Personal history of nicotine dependence: Secondary | ICD-10-CM | POA: Diagnosis not present

## 2016-08-31 DIAGNOSIS — Z7982 Long term (current) use of aspirin: Secondary | ICD-10-CM | POA: Diagnosis not present

## 2016-08-31 DIAGNOSIS — Z96642 Presence of left artificial hip joint: Secondary | ICD-10-CM | POA: Diagnosis not present

## 2016-08-31 DIAGNOSIS — K219 Gastro-esophageal reflux disease without esophagitis: Secondary | ICD-10-CM | POA: Insufficient documentation

## 2016-08-31 DIAGNOSIS — E039 Hypothyroidism, unspecified: Secondary | ICD-10-CM | POA: Insufficient documentation

## 2016-08-31 DIAGNOSIS — R51 Headache: Secondary | ICD-10-CM | POA: Diagnosis present

## 2016-08-31 DIAGNOSIS — R001 Bradycardia, unspecified: Secondary | ICD-10-CM | POA: Insufficient documentation

## 2016-08-31 DIAGNOSIS — I671 Cerebral aneurysm, nonruptured: Secondary | ICD-10-CM

## 2016-08-31 DIAGNOSIS — Z9842 Cataract extraction status, left eye: Secondary | ICD-10-CM | POA: Diagnosis not present

## 2016-08-31 DIAGNOSIS — Z539 Procedure and treatment not carried out, unspecified reason: Secondary | ICD-10-CM | POA: Diagnosis not present

## 2016-08-31 DIAGNOSIS — M1990 Unspecified osteoarthritis, unspecified site: Secondary | ICD-10-CM | POA: Insufficient documentation

## 2016-08-31 DIAGNOSIS — Z01812 Encounter for preprocedural laboratory examination: Secondary | ICD-10-CM | POA: Insufficient documentation

## 2016-08-31 DIAGNOSIS — G8929 Other chronic pain: Secondary | ICD-10-CM | POA: Diagnosis not present

## 2016-08-31 DIAGNOSIS — Z888 Allergy status to other drugs, medicaments and biological substances status: Secondary | ICD-10-CM | POA: Diagnosis not present

## 2016-08-31 DIAGNOSIS — Z01810 Encounter for preprocedural cardiovascular examination: Secondary | ICD-10-CM

## 2016-08-31 DIAGNOSIS — Z880 Allergy status to penicillin: Secondary | ICD-10-CM | POA: Diagnosis not present

## 2016-08-31 DIAGNOSIS — I10 Essential (primary) hypertension: Secondary | ICD-10-CM | POA: Insufficient documentation

## 2016-08-31 DIAGNOSIS — M4800 Spinal stenosis, site unspecified: Secondary | ICD-10-CM | POA: Diagnosis not present

## 2016-08-31 DIAGNOSIS — Z9841 Cataract extraction status, right eye: Secondary | ICD-10-CM | POA: Diagnosis not present

## 2016-08-31 DIAGNOSIS — M549 Dorsalgia, unspecified: Secondary | ICD-10-CM | POA: Diagnosis not present

## 2016-08-31 DIAGNOSIS — E7800 Pure hypercholesterolemia, unspecified: Secondary | ICD-10-CM | POA: Diagnosis not present

## 2016-08-31 HISTORY — DX: Headache, unspecified: R51.9

## 2016-08-31 HISTORY — DX: Headache: R51

## 2016-08-31 HISTORY — DX: Dorsalgia, unspecified: M54.9

## 2016-08-31 LAB — CBC WITH DIFFERENTIAL/PLATELET
BASOS ABS: 0 10*3/uL (ref 0.0–0.1)
BASOS PCT: 0 %
EOS ABS: 0.2 10*3/uL (ref 0.0–0.7)
EOS PCT: 3 %
HCT: 42 % (ref 36.0–46.0)
HEMOGLOBIN: 13.7 g/dL (ref 12.0–15.0)
Lymphocytes Relative: 25 %
Lymphs Abs: 1.9 10*3/uL (ref 0.7–4.0)
MCH: 30.5 pg (ref 26.0–34.0)
MCHC: 32.6 g/dL (ref 30.0–36.0)
MCV: 93.5 fL (ref 78.0–100.0)
Monocytes Absolute: 0.7 10*3/uL (ref 0.1–1.0)
Monocytes Relative: 9 %
NEUTROS PCT: 63 %
Neutro Abs: 4.8 10*3/uL (ref 1.7–7.7)
PLATELETS: 125 10*3/uL — AB (ref 150–400)
RBC: 4.49 MIL/uL (ref 3.87–5.11)
RDW: 13.3 % (ref 11.5–15.5)
WBC: 7.6 10*3/uL (ref 4.0–10.5)

## 2016-08-31 LAB — COMPREHENSIVE METABOLIC PANEL
ALBUMIN: 4.2 g/dL (ref 3.5–5.0)
ALK PHOS: 53 U/L (ref 38–126)
ALT: 13 U/L — AB (ref 14–54)
AST: 22 U/L (ref 15–41)
Anion gap: 9 (ref 5–15)
BUN: 16 mg/dL (ref 6–20)
CHLORIDE: 104 mmol/L (ref 101–111)
CO2: 26 mmol/L (ref 22–32)
CREATININE: 0.89 mg/dL (ref 0.44–1.00)
Calcium: 9.5 mg/dL (ref 8.9–10.3)
GFR calc non Af Amer: 57 mL/min — ABNORMAL LOW (ref >60)
GLUCOSE: 84 mg/dL (ref 65–99)
Potassium: 4.3 mmol/L (ref 3.5–5.1)
SODIUM: 139 mmol/L (ref 135–145)
Total Bilirubin: 1 mg/dL (ref 0.3–1.2)
Total Protein: 7.2 g/dL (ref 6.5–8.1)

## 2016-08-31 LAB — PROTIME-INR
INR: 1.1
Prothrombin Time: 14.2 seconds (ref 11.4–15.2)

## 2016-08-31 LAB — PLATELET INHIBITION P2Y12: Platelet Function  P2Y12: 185 [PRU] — ABNORMAL LOW (ref 194–418)

## 2016-08-31 LAB — APTT: APTT: 35 s (ref 24–36)

## 2016-08-31 NOTE — Progress Notes (Signed)
PCP: Dr. Nance PewPiaza, pt has yearly physical [facility] with PA Turbyfill  EKG: October 2017, requested from [facility], EKG repeated today d/t pt complaint of occasional  Chest pressure, last 2-3 days ago, pt states she has not mentioned this to any doctor. Pt states pressure goes away by itself and is not having pressure today. Anesthesia notified and to come see patient today.   Pt denies cardiac hx or cardiac workup, no cardiologist.   Pt has been taking aspirin and plavix and will continue to take prior to surgery.

## 2016-08-31 NOTE — Progress Notes (Signed)
Anesthesia PAT Evaluation: Patient is a 83 year old female scheduled for embolization on left ICA aneurysm on 09/02/16 by Dr. Corliss Skainseveshwar. IR notes indicate that the endovascular options include stent assisted coiling versus coiling with placement of a flow diverter device.  History includes former smoker (quit '85), bradycardia, HTN, hypothyroidism, GERD, arthritis, left THA 08/27/15, tonsillectomy, appendectomy. She and her husband [facility].   PCP is with [facility] (see Care Everywhere). She normally sees Dr. Lynelle SmokeMichel Piazza or [place] Tur[place]fill, ANP, but saw Raelyn MoraEileen Quilty, [facility] on 08/27/16 due to evaluation of edema (patient wanted evaluated prior to aneurysm repair). She is apparently on Lasix MWF for edema, but legs were not noted to be edematous at that time. A/P lists edema due to CHF, but patient does not recall ever being told she has had CHF and denied prior cardiac testing such as echo, stress, or cardiac cath. (Prior to his entry, I do not see CHF listed as a diagnosis.) Staff at [facility] confirmed no prior cardiac testing seen there other than EKGs.   Meds include ASA 81 mg, atenolol, Lipitor, Lumigan ophthalmic, Plavix, 65 Fe, Lasix, Neurontin, melatonin, Prilosec, tramadol. She has been on Plavix and ASA for 5 days now (to take for a week prior to her procedure).   BP (!) 143/75   Pulse 60   Temp 36.3 C   Resp 18   Ht 5\' 3"  (1.6 m)   Wt 152 lb 14.4 oz (69.4 kg)   SpO2 99%   BMI 27.09 kg/m  Exam shows a pleasant Caucasian female in NAD. Kyphotic posture. Heart RRR with occasional ectopic beat. II/VI SEM heard best on right. Lungs clear, but diminished bases. No ankle edema. In her last visits with Dr. Corliss Skainseveshwar and Lenore [facility]annerQuilty, [facility] she denied chest pain; however, today she reported that she occasionally gets chest pressure that lasts 1-2 minutes, last time about 3 days ago but details unclear. She has not  noticed any relation with activity. She says it could occur up to once a month, but had been occurring for at least five years. She "hardly notices it" so she has not mentioned it in the past. There is no associated nausea, diaphoresis, jaw or arm pain, palpitations, dizziness, or syncope. She is not particularly active, but can bake and fold clothes. She was playing chair volleyball up to a year ago, but had to stop due to back pain. She did not notice chest symptoms with these activities. She primarily walks with a walker.   EKG 08/31/16: SB at 54 bpm, PACs, Negative T wave V1 and III. EKG appears stable when compared to 08/12/15 tracing.  Preoperative labs noted. Cr 0.89. H/H 13.7/42.0, PLT 125K. PT/PTT WNL. Glucose 84. P2y12 was 185--called to MaeserJennifer in IR. Defer decision to repeat p2y12 on the day of surgery to Dr. Corliss Skainseveshwar. I also notified her that patient takes Plavix in the evening and not the morning.   Above reviewed with anesthesiologist Dr. Chilton SiGreen with recommendation to talk with PCP regarding procedure plans and inquire if anything else recommended prior to aneurysm embolization. I called and spoke with Phineas SemenHolly Trubyfill, NP and reported patient's symptoms and findings of grade II/VI SEM. She will re-evaluate for murmur at patient's next visit, but since patient described stable symptoms for over 5 years, had stable EKG, no CHF symptoms on exam or presyncope/syncope, and tolerated surgery last year then she would not recommend anything further prior to IR  procedure. Patient will be re-evaluated by her assigned anesthesiologist prior to her aneurysm embolization to ensure no new or progressive symptoms prior to proceeding. I updated Jennifer in [employer].  Velna Ochs [facility] Short Stay Center/Anesthesiology Phone [phone] 08/31/2016 1:28 PM

## 2016-09-01 ENCOUNTER — Other Ambulatory Visit: Payer: Self-pay | Admitting: Radiology

## 2016-09-01 NOTE — Anesthesia Preprocedure Evaluation (Deleted)
Anesthesia Evaluation    Airway        Dental   Pulmonary former smoker,           Cardiovascular hypertension,      Neuro/Psych  Headaches,    GI/Hepatic GERD  ,  Endo/Other  Hypothyroidism   Renal/GU      Musculoskeletal  (+) Arthritis ,   Abdominal   Peds  Hematology   Anesthesia Other Findings   Reproductive/Obstetrics                             Anesthesia Physical Anesthesia Plan  ASA: III  Anesthesia Plan: General   Post-op Pain Management:    Induction:   Airway Management Planned: Oral ETT  Additional Equipment: Arterial line  Intra-op Plan:   Post-operative Plan:   Informed Consent:   Plan Discussed with: CRNA  Anesthesia Plan Comments: (See A Zelenak PA Anesth Note.)        Anesthesia Quick Evaluation

## 2016-09-02 ENCOUNTER — Encounter (HOSPITAL_COMMUNITY): Payer: Self-pay | Admitting: *Deleted

## 2016-09-02 ENCOUNTER — Ambulatory Visit (HOSPITAL_COMMUNITY): Admission: RE | Admit: 2016-09-02 | Payer: Medicare Other | Source: Ambulatory Visit

## 2016-09-02 ENCOUNTER — Ambulatory Visit (HOSPITAL_COMMUNITY)
Admission: RE | Admit: 2016-09-02 | Discharge: 2016-09-02 | Disposition: A | Discharge status: Home / Self Care | Payer: Medicare Other | Source: Ambulatory Visit | Attending: Interventional Radiology | Admitting: Interventional Radiology

## 2016-09-02 ENCOUNTER — Other Ambulatory Visit: Payer: Self-pay | Admitting: Radiology

## 2016-09-02 ENCOUNTER — Encounter (HOSPITAL_COMMUNITY)
Admission: RE | Disposition: A | Discharge status: Home / Self Care | Payer: Self-pay | Source: Ambulatory Visit | Attending: Interventional Radiology

## 2016-09-02 DIAGNOSIS — Z96642 Presence of left artificial hip joint: Secondary | ICD-10-CM | POA: Insufficient documentation

## 2016-09-02 DIAGNOSIS — Z539 Procedure and treatment not carried out, unspecified reason: Secondary | ICD-10-CM | POA: Insufficient documentation

## 2016-09-02 DIAGNOSIS — M549 Dorsalgia, unspecified: Secondary | ICD-10-CM | POA: Insufficient documentation

## 2016-09-02 DIAGNOSIS — I10 Essential (primary) hypertension: Secondary | ICD-10-CM | POA: Insufficient documentation

## 2016-09-02 DIAGNOSIS — Z9842 Cataract extraction status, left eye: Secondary | ICD-10-CM | POA: Insufficient documentation

## 2016-09-02 DIAGNOSIS — K219 Gastro-esophageal reflux disease without esophagitis: Secondary | ICD-10-CM | POA: Insufficient documentation

## 2016-09-02 DIAGNOSIS — Z01818 Encounter for other preprocedural examination: Secondary | ICD-10-CM | POA: Insufficient documentation

## 2016-09-02 DIAGNOSIS — Z880 Allergy status to penicillin: Secondary | ICD-10-CM | POA: Insufficient documentation

## 2016-09-02 DIAGNOSIS — R51 Headache: Secondary | ICD-10-CM | POA: Insufficient documentation

## 2016-09-02 DIAGNOSIS — M1990 Unspecified osteoarthritis, unspecified site: Secondary | ICD-10-CM | POA: Insufficient documentation

## 2016-09-02 DIAGNOSIS — E7800 Pure hypercholesterolemia, unspecified: Secondary | ICD-10-CM | POA: Insufficient documentation

## 2016-09-02 DIAGNOSIS — G8929 Other chronic pain: Secondary | ICD-10-CM | POA: Insufficient documentation

## 2016-09-02 DIAGNOSIS — Z9841 Cataract extraction status, right eye: Secondary | ICD-10-CM | POA: Insufficient documentation

## 2016-09-02 DIAGNOSIS — Z79899 Other long term (current) drug therapy: Secondary | ICD-10-CM | POA: Insufficient documentation

## 2016-09-02 DIAGNOSIS — Z888 Allergy status to other drugs, medicaments and biological substances status: Secondary | ICD-10-CM | POA: Insufficient documentation

## 2016-09-02 DIAGNOSIS — I671 Cerebral aneurysm, nonruptured: Secondary | ICD-10-CM | POA: Insufficient documentation

## 2016-09-02 DIAGNOSIS — Z7982 Long term (current) use of aspirin: Secondary | ICD-10-CM | POA: Insufficient documentation

## 2016-09-02 DIAGNOSIS — M4800 Spinal stenosis, site unspecified: Secondary | ICD-10-CM | POA: Insufficient documentation

## 2016-09-02 DIAGNOSIS — Z87891 Personal history of nicotine dependence: Secondary | ICD-10-CM | POA: Insufficient documentation

## 2016-09-02 LAB — PLATELET INHIBITION P2Y12: Platelet Function  P2Y12: 186 [PRU] — ABNORMAL LOW (ref 194–418)

## 2016-09-02 SURGERY — RADIOLOGY WITH ANESTHESIA
Anesthesia: General

## 2016-09-02 MED ORDER — CLOPIDOGREL BISULFATE 75 MG PO TABS: 75.0000 mg | ORAL_TABLET | Freq: Every day | ORAL | Status: DC

## 2016-09-02 MED ORDER — VANCOMYCIN HCL 1000 MG IV SOLR: 1000.0000 mg | INTRAVENOUS | Status: DC

## 2016-09-02 MED ORDER — CLOPIDOGREL BISULFATE 75 MG PO TABS
75.0000 mg | ORAL_TABLET | ORAL | Status: AC
  Administered 2016-09-02: 75 mg via ORAL
  Filled 2016-09-02 (×2): qty 1

## 2016-09-02 MED ORDER — NIMODIPINE 30 MG PO CAPS
0.0000 mg | ORAL_CAPSULE | ORAL | Status: AC
  Administered 2016-09-02: 30 mg via ORAL
  Filled 2016-09-02: qty 1
  Filled 2016-09-02: qty 2

## 2016-09-02 MED ORDER — SODIUM CHLORIDE 0.9 % IV SOLN
INTRAVENOUS | Status: DC
  Administered 2016-09-02: 08:00:00 via INTRAVENOUS

## 2016-09-02 MED ORDER — ASPIRIN EC 325 MG PO TBEC
325.0000 mg | DELAYED_RELEASE_TABLET | ORAL | Status: DC
  Filled 2016-09-02: qty 1

## 2016-09-02 MED ORDER — VANCOMYCIN HCL IN DEXTROSE 1-5 GM/200ML-% IV SOLN
1000.0000 mg | Freq: Once | INTRAVENOUS | Status: DC
  Filled 2016-09-02 (×2): qty 200

## 2016-09-02 NOTE — H&P (Signed)
Chief Complaint: Patient was seen in consultation today for cerebral arteriogram with possible embolization of Left internal carotid artery aneurysm at the request of Dr Kenyon AnaL Tonuzi  Referring Physician(s): Dr Kenyon AnaL Tonuzi  Supervising Physician: Julieanne Cottoneveshwar, Sanjeev  [facility]  History of Present Illness: Kara DunnLucy L Lester is a 83 y.o. female   Persistent; severe headaches Known L ICA aneurysm Previous imaging 09/2013: Cerebral arteriogram: IMPRESSION: Approximately 6.8 mm x 4.6 mm wide necked slightly lobular aneurysm arising from the posterior wall of the left internal carotid artery distal to the origin of the ophthalmic artery  Seen with continued headaches just 07/14/16: MR: IMPRESSION: MRI HEAD: No acute intracranial process. Moderate chronic small vessel ischemic disease. MRA HEAD: Stable appearance of 5 x 4 mm LEFT supraclinoid/para ophthalmic intact aneurysm. No emergent large vessel occlusion or severe stenosis.  Referred back to Dr Corliss Skainseveshwar for evaluation and possible treatment Now scheduled for cerebral arteriogram with possible embolization of left internal carotid artery aneurysm    Past Medical History:  Diagnosis Date  . Aneurysm (HCC)    cerebral" no surgical repair"- frquent headaches . or lessening-Dr. Tonuzi-[phone],neurology  . Arthritis    Osteoarthritis- "spinal stenosis"- hip and back pain.  . Back pain    chronic back pain, cannot lie flat  . GERD (gastroesophageal reflux disease)   . Headache   . Heart rate slow    history of  . Hypercholesteremia   . Hypertension   . Hypothyroidism    childhood years- no issues    Past Surgical History:  Procedure Laterality Date  . APPENDECTOMY    . CATARACT EXTRACTION, BILATERAL Bilateral   . IR GENERIC HISTORICAL  06/30/2016   IR RADIOLOGIST EVAL & MGMT 06/30/2016 [facility] RAD  . IR GENERIC HISTORICAL  07/30/2016   IR RADIOLOGIST EVAL & MGMT 07/30/2016 [facility]  . TONSILLECTOMY     . TOTAL HIP ARTHROPLASTY Left 08/27/2015   Procedure: LEFT TOTAL HIP ARTHROPLASTY ANTERIOR APPROACH;  Surgeon: Durene RomansMatthew Olin, MD;  Location: [facility];  Service: Orthopedics;  Laterality: Left;    Allergies: Hydrochlorothiazide w-spironolactone; Niacin; Penicillins; Raloxifene; and Tussionex pennkinetic er [hydrocod polst-cpm polst er]  Medications: Prior to Admission medications   Medication Sig Start Date End Date Taking? Authorizing Provider  acetaminophen (TYLENOL) 500 MG tablet Take 2 tablets (1,000 mg total) by mouth every 8 (eight) hours. Patient taking differently: Take 1,000 mg by mouth 2 (two) times daily.  08/29/15  Yes Lanney GinsMatthew Babish, PA-C  aspirin EC 81 MG tablet Take 81 mg by mouth daily.   Yes Historical Provider, MD  atenolol (TENORMIN) 25 MG tablet Take 25 mg by mouth 2 (two) times daily.   Yes Historical Provider, MD  atorvastatin (LIPITOR) 10 MG tablet Take 10 mg by mouth every evening.    Yes Historical Provider, MD  bimatoprost (LUMIGAN) 0.01 % SOLN Place 1 drop into both eyes at bedtime.    Yes Historical Provider, MD  clopidogrel (PLAVIX) 75 MG tablet Take 75 mg by mouth at bedtime.   Yes Historical Provider, MD  docusate sodium (COLACE) 100 MG capsule Take 1 capsule (100 mg total) by mouth 2 (two) times daily. Patient taking differently: Take 100 mg by mouth 2 (two) times daily as needed for mild constipation or moderate constipation.  08/29/15  Yes Lanney GinsMatthew Babish, PA-C  furosemide (LASIX) 20 MG tablet Take 20 mg by mouth every Monday, Wednesday, and Friday.   Yes Historical Provider, MD  gabapentin (NEURONTIN) 300 MG capsule  Take 300 mg by mouth 3 (three) times daily.    Yes Historical Provider, MD  Melatonin 3 MG TABS Take 3 mg by mouth at bedtime as needed (sleep).   Yes Historical Provider, MD  Menthol, Topical Analgesic, (ICY HOT EX) Apply 1 application topically daily as needed (pain).   Yes Historical Provider, MD  omeprazole (PRILOSEC) 20 MG capsule Take 20 mg by  mouth daily.   Yes Historical Provider, MD  polyethylene glycol (MIRALAX / GLYCOLAX) packet Take 17 g by mouth 2 (two) times daily. Patient taking differently: Take 17 g by mouth daily as needed for mild constipation.  08/29/15  Yes Lanney Gins, PA-C  traMADol (ULTRAM) 50 MG tablet Take 1-2 tablets (50-100 mg total) by mouth every 6 (six) hours as needed for moderate pain or severe pain. Patient taking differently: Take 50-100 mg by mouth every 6 (six) hours as needed for moderate pain or severe pain. Depends on headache pain if takes 1-2 tablets 08/29/15  Yes Lanney Gins, PA-C  ferrous sulfate 325 (65 FE) MG tablet Take 1 tablet (325 mg total) by mouth 3 (three) times daily after meals. Patient not taking: Reported on 08/26/2016 08/29/15   Lanney Gins, PA-C     History reviewed. No pertinent family history.  Social History   Social History  . Marital status: Married    Spouse name: N/A  . Number of children: N/A  . Years of education: N/A   Social History Main Topics  . Smoking status: Former Smoker    Types: Cigarettes  . Smokeless tobacco: Never Used     Comment: age 22- quit"social smoker only"  . Alcohol use Yes     Comment: rare now. "a little wine every once in a while"   . Drug use: No  . Sexual activity: Not Asked   Other Topics Concern  . None   Social History Narrative  . None    Review of Systems: A 12 point ROS discussed and pertinent positives are indicated in the HPI above.  All other systems are negative.  Review of Systems  Constitutional: Negative for activity change, appetite change, fatigue, fever and unexpected weight change.  HENT: Positive for hearing loss. Negative for sore throat, tinnitus, trouble swallowing and voice change.   Eyes: Negative for photophobia and visual disturbance.  Respiratory: Negative for cough, chest tightness and shortness of breath.   Cardiovascular: Negative for chest pain.  Gastrointestinal: Negative for abdominal pain.    Musculoskeletal: Negative for gait problem.  Neurological: Positive for headaches. Negative for dizziness, tremors, seizures, syncope, facial asymmetry, speech difficulty, weakness, light-headedness and numbness.  Psychiatric/Behavioral: Negative for behavioral problems and confusion.    Vital Signs: BP 135/72   Pulse 61   Temp 97.5 F (36.4 C) (Oral)   Resp 18   Wt 152 lb (68.9 kg)   SpO2 98%   BMI 26.93 kg/m   Physical Exam  Constitutional: She is oriented to person, place, and time. She appears well-nourished.  HENT:  Head: Atraumatic.  Eyes: EOM are normal.  Neck: Neck supple.  Cardiovascular: Regular rhythm.   Murmur heard. Pulmonary/Chest: Effort normal and breath sounds normal. She has no wheezes.  Abdominal: Soft. Bowel sounds are normal. There is no tenderness.  Musculoskeletal: Normal range of motion.  Neurological: She is alert and oriented to person, place, and time.  Skin: Skin is warm and dry.  Psychiatric: She has a normal mood and affect. Her behavior is normal. Judgment and thought content normal.  Nursing note and vitals reviewed.   Mallampati Score:  MD Evaluation Airway: WNL Heart: WNL Abdomen: WNL Chest/ Lungs: WNL ASA  Classification: 3 Mallampati/Airway Score: One  Imaging: No results found.  Labs:  CBC:  Recent Labs  08/31/16 1048  WBC 7.6  HGB 13.7  HCT 42.0  PLT 125*    COAGS:  Recent Labs  08/31/16 1048  INR 1.10  APTT 35    BMP:  Recent Labs  07/14/16 1210 08/31/16 1048  NA  --  139  K  --  4.3  CL  --  104  CO2  --  26  GLUCOSE  --  84  BUN  --  16  CALCIUM  --  9.5  CREATININE 0.94 0.89  GFRNONAA 53* 57*  GFRAA >60 >60    LIVER FUNCTION TESTS:  Recent Labs  08/31/16 1048  BILITOT 1.0  AST 22  ALT 13*  ALKPHOS 53  PROT 7.2  ALBUMIN 4.2    TUMOR MARKERS: No results for input(s): AFPTM, CEA, CA199, CHROMGRNA in the last 8760 hours.  Assessment and Plan:  Persistent headaches Known  Left internal carotid artery aneurysm Stable since 2015 Scheduled for cerebral arteriogram with possible L ICA aneurysm embolization Risks and Benefits discussed with the patient including, but not limited to bleeding, infection, vascular injury, contrast induced renal failure, stroke or even death. All of the patient's questions were answered, patient is agreeable to proceed. Consent signed and in chart.  Patient is aware if intervention is performed, she will be admitted to Neuro ICU overnight. Plan for discharge in am Agreeable to proceed.  Thank you for this interesting consult.  I greatly enjoyed meeting SHANTELLA BLUBAUGH and look forward to participating in their care.  A copy of this report was sent to the requesting provider on this date.  Electronically Signed: Ralene Muskrat A 09/02/2016, 7:48 AM   I spent a total of  30 Minutes   in face to face in clinical consultation, greater than 50% of which was counseling/coordinating care for L ICA aneurysm embolization

## 2016-09-04 ENCOUNTER — Encounter (HOSPITAL_COMMUNITY): Payer: Self-pay | Admitting: *Deleted

## 2016-09-04 ENCOUNTER — Other Ambulatory Visit: Payer: Self-pay | Admitting: Radiology

## 2016-09-07 ENCOUNTER — Encounter (HOSPITAL_COMMUNITY): Payer: Self-pay | Admitting: Cardiology

## 2016-09-07 ENCOUNTER — Other Ambulatory Visit: Payer: Self-pay | Admitting: Cardiology

## 2016-09-07 ENCOUNTER — Ambulatory Visit (HOSPITAL_COMMUNITY): Payer: Medicare Other | Admitting: Anesthesiology

## 2016-09-07 ENCOUNTER — Encounter (HOSPITAL_COMMUNITY)
Admission: RE | Disposition: A | Discharge status: Home / Self Care | Payer: Self-pay | Source: Ambulatory Visit | Attending: Interventional Radiology

## 2016-09-07 ENCOUNTER — Encounter (HOSPITAL_COMMUNITY): Payer: Self-pay

## 2016-09-07 ENCOUNTER — Ambulatory Visit (HOSPITAL_COMMUNITY)
Admission: RE | Admit: 2016-09-07 | Discharge: 2016-09-07 | Disposition: A | Discharge status: Home / Self Care | Payer: Medicare Other | Source: Ambulatory Visit | Attending: Interventional Radiology | Admitting: Interventional Radiology

## 2016-09-07 ENCOUNTER — Encounter (HOSPITAL_COMMUNITY): Payer: Self-pay | Admitting: Urology

## 2016-09-07 DIAGNOSIS — R079 Chest pain, unspecified: Secondary | ICD-10-CM

## 2016-09-07 DIAGNOSIS — Z9841 Cataract extraction status, right eye: Secondary | ICD-10-CM | POA: Insufficient documentation

## 2016-09-07 DIAGNOSIS — Z87891 Personal history of nicotine dependence: Secondary | ICD-10-CM | POA: Diagnosis not present

## 2016-09-07 DIAGNOSIS — I10 Essential (primary) hypertension: Secondary | ICD-10-CM | POA: Diagnosis not present

## 2016-09-07 DIAGNOSIS — Z96642 Presence of left artificial hip joint: Secondary | ICD-10-CM | POA: Insufficient documentation

## 2016-09-07 DIAGNOSIS — I739 Peripheral vascular disease, unspecified: Secondary | ICD-10-CM | POA: Diagnosis not present

## 2016-09-07 DIAGNOSIS — I350 Nonrheumatic aortic (valve) stenosis: Secondary | ICD-10-CM | POA: Diagnosis not present

## 2016-09-07 DIAGNOSIS — Z880 Allergy status to penicillin: Secondary | ICD-10-CM | POA: Insufficient documentation

## 2016-09-07 DIAGNOSIS — Z888 Allergy status to other drugs, medicaments and biological substances status: Secondary | ICD-10-CM | POA: Diagnosis not present

## 2016-09-07 DIAGNOSIS — I6521 Occlusion and stenosis of right carotid artery: Secondary | ICD-10-CM | POA: Insufficient documentation

## 2016-09-07 DIAGNOSIS — G8929 Other chronic pain: Secondary | ICD-10-CM | POA: Diagnosis not present

## 2016-09-07 DIAGNOSIS — R51 Headache: Secondary | ICD-10-CM | POA: Diagnosis not present

## 2016-09-07 DIAGNOSIS — M549 Dorsalgia, unspecified: Secondary | ICD-10-CM | POA: Insufficient documentation

## 2016-09-07 DIAGNOSIS — Z01810 Encounter for preprocedural cardiovascular examination: Secondary | ICD-10-CM

## 2016-09-07 DIAGNOSIS — Z79899 Other long term (current) drug therapy: Secondary | ICD-10-CM | POA: Diagnosis not present

## 2016-09-07 DIAGNOSIS — Z9842 Cataract extraction status, left eye: Secondary | ICD-10-CM | POA: Diagnosis not present

## 2016-09-07 DIAGNOSIS — Z7982 Long term (current) use of aspirin: Secondary | ICD-10-CM | POA: Insufficient documentation

## 2016-09-07 DIAGNOSIS — E7800 Pure hypercholesterolemia, unspecified: Secondary | ICD-10-CM | POA: Diagnosis not present

## 2016-09-07 DIAGNOSIS — I671 Cerebral aneurysm, nonruptured: Secondary | ICD-10-CM

## 2016-09-07 DIAGNOSIS — Z7902 Long term (current) use of antithrombotics/antiplatelets: Secondary | ICD-10-CM | POA: Insufficient documentation

## 2016-09-07 DIAGNOSIS — I499 Cardiac arrhythmia, unspecified: Secondary | ICD-10-CM | POA: Insufficient documentation

## 2016-09-07 DIAGNOSIS — E039 Hypothyroidism, unspecified: Secondary | ICD-10-CM | POA: Insufficient documentation

## 2016-09-07 DIAGNOSIS — K219 Gastro-esophageal reflux disease without esophagitis: Secondary | ICD-10-CM | POA: Diagnosis not present

## 2016-09-07 HISTORY — PX: IR GENERIC HISTORICAL: IMG1180011

## 2016-09-07 HISTORY — PX: RADIOLOGY WITH ANESTHESIA: SHX6223

## 2016-09-07 LAB — CBC WITH DIFFERENTIAL/PLATELET
BASOS PCT: 0 %
Basophils Absolute: 0 10*3/uL (ref 0.0–0.1)
EOS ABS: 0.3 10*3/uL (ref 0.0–0.7)
EOS PCT: 4 %
HEMATOCRIT: 38.8 % (ref 36.0–46.0)
Hemoglobin: 12.8 g/dL (ref 12.0–15.0)
Lymphocytes Relative: 26 %
Lymphs Abs: 2 10*3/uL (ref 0.7–4.0)
MCH: 30.8 pg (ref 26.0–34.0)
MCHC: 33 g/dL (ref 30.0–36.0)
MCV: 93.5 fL (ref 78.0–100.0)
MONO ABS: 0.7 10*3/uL (ref 0.1–1.0)
MONOS PCT: 9 %
Neutro Abs: 4.7 10*3/uL (ref 1.7–7.7)
Neutrophils Relative %: 62 %
PLATELETS: 116 10*3/uL — AB (ref 150–400)
RBC: 4.15 MIL/uL (ref 3.87–5.11)
RDW: 13.4 % (ref 11.5–15.5)
WBC: 7.6 10*3/uL (ref 4.0–10.5)

## 2016-09-07 LAB — BASIC METABOLIC PANEL
Anion gap: 10 (ref 5–15)
BUN: 14 mg/dL (ref 6–20)
CALCIUM: 9.1 mg/dL (ref 8.9–10.3)
CO2: 24 mmol/L (ref 22–32)
CREATININE: 0.92 mg/dL (ref 0.44–1.00)
Chloride: 108 mmol/L (ref 101–111)
GFR calc Af Amer: >60 mL/min (ref >60)
GFR calc non Af Amer: 54 mL/min — ABNORMAL LOW (ref >60)
GLUCOSE: 105 mg/dL — AB (ref 65–99)
Potassium: 4.5 mmol/L (ref 3.5–5.1)
Sodium: 142 mmol/L (ref 135–145)

## 2016-09-07 LAB — PROTIME-INR
INR: 1.07
Prothrombin Time: 13.9 seconds (ref 11.4–15.2)

## 2016-09-07 LAB — PLATELET INHIBITION P2Y12: Platelet Function  P2Y12: 214 [PRU] (ref 194–418)

## 2016-09-07 LAB — APTT: APTT: 33 s (ref 24–36)

## 2016-09-07 SURGERY — RADIOLOGY WITH ANESTHESIA
Anesthesia: Monitor Anesthesia Care

## 2016-09-07 MED ORDER — TRAMADOL HCL 50 MG PO TABS
50.0000 mg | ORAL_TABLET | Freq: Once | ORAL | Status: AC | PRN
  Administered 2016-09-07: 50 mg via ORAL

## 2016-09-07 MED ORDER — FENTANYL CITRATE (PF) 100 MCG/2ML IJ SOLN
INTRAMUSCULAR | Status: DC | PRN
  Administered 2016-09-07 (×2): 25 ug via INTRAVENOUS

## 2016-09-07 MED ORDER — CLOPIDOGREL BISULFATE 75 MG PO TABS
ORAL_TABLET | ORAL | Status: AC
  Administered 2016-09-07: 150 mg via ORAL
  Filled 2016-09-07: qty 2

## 2016-09-07 MED ORDER — EPHEDRINE SULFATE 50 MG/ML IJ SOLN
INTRAMUSCULAR | Status: DC | PRN
  Administered 2016-09-07 (×2): 5 mg via INTRAVENOUS
  Administered 2016-09-07: 10 mg via INTRAVENOUS
  Administered 2016-09-07: 5 mg via INTRAVENOUS

## 2016-09-07 MED ORDER — TRAMADOL HCL 50 MG PO TABS
ORAL_TABLET | ORAL | Status: AC
  Filled 2016-09-07: qty 1

## 2016-09-07 MED ORDER — ONDANSETRON HCL 4 MG/2ML IJ SOLN
INTRAMUSCULAR | Status: DC | PRN
  Administered 2016-09-07: 4 mg via INTRAVENOUS

## 2016-09-07 MED ORDER — LIDOCAINE HCL (PF) 1 % IJ SOLN
INTRAMUSCULAR | Status: AC | PRN
  Administered 2016-09-07: 10 mL

## 2016-09-07 MED ORDER — IOPAMIDOL (ISOVUE-300) INJECTION 61%
INTRAVENOUS | Status: AC
  Administered 2016-09-07: 20 mL
  Filled 2016-09-07: qty 50

## 2016-09-07 MED ORDER — LIDOCAINE HCL 1 % IJ SOLN
INTRAMUSCULAR | Status: AC
  Filled 2016-09-07: qty 20

## 2016-09-07 MED ORDER — HEPARIN SODIUM (PORCINE) 1000 UNIT/ML IJ SOLN
INTRAMUSCULAR | Status: DC | PRN
  Administered 2016-09-07: 1000 [IU] via INTRAVENOUS

## 2016-09-07 MED ORDER — IOPAMIDOL (ISOVUE-300) INJECTION 61%
INTRAVENOUS | Status: AC
  Administered 2016-09-07: 50 mL
  Filled 2016-09-07: qty 150

## 2016-09-07 MED ORDER — SODIUM CHLORIDE 0.9 % IV SOLN
INTRAVENOUS | Status: DC | PRN
  Administered 2016-09-07: 08:00:00 via INTRAVENOUS

## 2016-09-07 MED ORDER — NIMODIPINE 30 MG PO CAPS
0.0000 mg | ORAL_CAPSULE | ORAL | Status: AC
  Administered 2016-09-07: 60 mg via ORAL
  Filled 2016-09-07 (×2): qty 2

## 2016-09-07 MED ORDER — CLOPIDOGREL BISULFATE 75 MG PO TABS
150.0000 mg | ORAL_TABLET | Freq: Once | ORAL | Status: AC
  Administered 2016-09-07: 150 mg via ORAL

## 2016-09-07 MED ORDER — SODIUM CHLORIDE 0.9 % IV SOLN
INTRAVENOUS | Status: DC
  Administered 2016-09-07: 08:00:00 via INTRAVENOUS

## 2016-09-07 MED ORDER — VANCOMYCIN HCL 1000 MG IV SOLR
1000.0000 mg | INTRAVENOUS | Status: AC
  Administered 2016-09-07: 1000 mg via INTRAVENOUS

## 2016-09-07 MED ORDER — SODIUM CHLORIDE 0.9 % IV SOLN: INTRAVENOUS | Status: AC

## 2016-09-07 MED ORDER — MIDAZOLAM HCL 5 MG/5ML IJ SOLN
INTRAMUSCULAR | Status: DC | PRN
  Administered 2016-09-07: 1 mg via INTRAVENOUS

## 2016-09-07 MED ORDER — CLOPIDOGREL BISULFATE 75 MG PO TABS: 150.0000 mg | ORAL_TABLET | Freq: Every day | ORAL | Status: DC

## 2016-09-07 NOTE — Procedures (Signed)
S/P 4 vessel cerebral arteriogream. RT CFA approach. Findings. 1.677mmx 6 mm x 4mm LT ICA paraclinoid intracranial aneurysm.

## 2016-09-07 NOTE — Anesthesia Procedure Notes (Signed)
Procedure Name: MAC Date/Time: 09/07/2016 9:50 AM Performed by: Izora Gala Pre-anesthesia Checklist: Patient identified and Patient being monitored Oxygen Delivery Method: Simple face mask Placement Confirmation: positive ETCO2

## 2016-09-07 NOTE — Transfer of Care (Signed)
Immediate Anesthesia Transfer of Care Note  Patient: Kara Lester  Procedure(s) Performed: Procedure(s): embolization (N/A)  Patient Location: PACU  Anesthesia Type:MAC  Level of Consciousness: awake, alert , oriented and patient cooperative  Airway & Oxygen Therapy: Patient Spontanous Breathing and Patient connected to nasal cannula oxygen  Post-op Assessment: Report given to RN, Post -op Vital signs reviewed and stable, Patient moving all extremities, Patient moving all extremities X 4 and Patient able to stick tongue midline  Post vital signs: Reviewed and stable  Last Vitals:  Vitals:   09/07/16 0709 09/07/16 0802  BP: (!) 148/89 (!) 146/94  Pulse: (!) 55 63  Resp: 18   Temp: 36.5 C     Last Pain:  Vitals:   09/07/16 0709  TempSrc: Oral         Complications: No apparent anesthesia complications

## 2016-09-07 NOTE — Sedation Documentation (Signed)
Anesthesia case 

## 2016-09-07 NOTE — Sedation Documentation (Signed)
5 Fr sheath removed from R fem artery by Stephens ShireMary Mazepa, RTR. Hemostasis achieved using V Pad and manual pressure. Groin level 0, 3+RDP, RPT + Doppler.

## 2016-09-07 NOTE — H&P (Signed)
Chief Complaint: left internal carotid artery aneurysm  Referring Physician:Dr. Maryjane Hurter  Supervising Physician: Luanne Bras  Patient Status: [facility] - Out-pt  HPI: Kara Lester is an 83 y.o. female who was recently seen on 3/14 for possible embolization of this left ICA aneurysm.  She was cancelled at that time secondary to an emergency.  She represents today for this procedure.  In questioning, the patient now states that she knows nothing about this murmur that she has and that it has never been evaluated.  She states that she has had some chest pain over the recent short-term while sitting.  It usually improves after 5 minutes or so.  She has never mentioned this to the physician at [place] where she lives.    Past Medical History:  Past Medical History:  Diagnosis Date  . Aneurysm ([place])    cerebral" no surgical repair"- frquent headaches . or lessening-Dr. Tonuzi-[phone],neurology  . Arthritis    Osteoarthritis- "spinal stenosis"- hip and back pain.  . Back pain    chronic back pain, cannot lie flat  . GERD (gastroesophageal reflux disease)   . Headache   . Heart rate slow    history of  . Hypercholesteremia   . Hypertension   . Hypothyroidism    childhood years- no issues    Past Surgical History:  Past Surgical History:  Procedure Laterality Date  . APPENDECTOMY    . CATARACT EXTRACTION, BILATERAL Bilateral   . IR GENERIC HISTORICAL  06/30/2016   IR RADIOLOGIST EVAL & MGMT 06/30/2016 [facility]  . IR GENERIC HISTORICAL  07/30/2016   IR RADIOLOGIST EVAL & MGMT 07/30/2016 [facility]  . TONSILLECTOMY    . TOTAL HIP ARTHROPLASTY Left 08/27/2015   Procedure: LEFT TOTAL HIP ARTHROPLASTY ANTERIOR APPROACH;  Surgeon: Paralee Cancel, MD;  Location: [facility];  Service: Orthopedics;  Laterality: Left;    Family History: History reviewed. No pertinent family history.  Social History:  reports that she has quit smoking. Her smoking use included Cigarettes. She has  never used smokeless tobacco. She reports that she drinks alcohol. She reports that she does not use drugs.  Allergies:  Allergies  Allergen Reactions  . Hydrochlorothiazide W-Spironolactone Rash  . Niacin Rash and Other (See Comments)  . Penicillins Rash    Has patient had a PCN reaction causing immediate rash, facial/tongue/throat swelling, SOB or lightheadedness with hypotension: Yes Has patient had a PCN reaction causing severe rash involving mucus membranes or skin necrosis: No Has patient had a PCN reaction that required hospitalization No Has patient had a PCN reaction occurring within the last 10 years: Yes If all of the above answers are "NO", then may proceed with Cephalosporin use.  . Raloxifene Rash  . Tussionex Pennkinetic Er [Hydrocod Polst-Cpm Polst Er] Rash    Medications: Medications reviewed in epic  Please HPI for pertinent positives, otherwise complete 10 system ROS negative.  Mallampati Score: MD Evaluation Airway: WNL Heart: Other (comments) Heart  comments: brady in 40s and loud murmur Abdomen: WNL Chest/ Lungs: WNL ASA  Classification: 3 Mallampati/Airway Score: Two  Physical Exam: Vitals: temp: 97.7 BP 100/63  HR 40s  Resp 18 General: pleasant, elderly white female who is laying in bed in NAD HEENT: head is normocephalic, atraumatic.  Sclera are noninjected.  PERRL.  Ears and nose without any masses or lesions.  Mouth is pink and moist Heart: sinus arrhythmia .  Normal s1,s2. Likely 3/6 murmur. No obvious gallops, or rubs noted.  Palpable  radial and pedal pulses bilaterally Lungs: CTAB, no wheezes, rhonchi, or rales noted.  Respiratory effort nonlabored Abd: soft, NT, ND, +BS, no masses, hernias, or organomegaly Neuro: grossly intact Psych: A&Ox3 with an appropriate affect.    Labs: Results for orders placed or performed during the hospital encounter of 09/07/16 (from the past 48 hour(s))  APTT     Status: None   Collection Time: 09/07/16  6:45  AM  Result Value Ref Range   aPTT 33 24 - 36 seconds  Basic metabolic panel     Status: Abnormal   Collection Time: 09/07/16  6:45 AM  Result Value Ref Range   Sodium 142 135 - 145 mmol/L   Potassium 4.5 3.5 - 5.1 mmol/L   Chloride 108 101 - 111 mmol/L   CO2 24 22 - 32 mmol/L   Glucose, Bld 105 (H) 65 - 99 mg/dL   BUN 14 6 - 20 mg/dL   Creatinine, Ser 0.92 0.44 - 1.00 mg/dL   Calcium 9.1 8.9 - 10.3 mg/dL   GFR calc non Af Amer 54 (L) >60 mL/min   GFR calc Af Amer >60 >60 mL/min    Comment: (NOTE) The eGFR has been calculated using the CKD EPI equation. This calculation has not been validated in all clinical situations. eGFR's persistently <60 mL/min signify possible Chronic Kidney Disease.    Anion gap 10 5 - 15  CBC WITH DIFFERENTIAL     Status: Abnormal   Collection Time: 09/07/16  6:45 AM  Result Value Ref Range   WBC 7.6 4.0 - 10.5 K/uL   RBC 4.15 3.87 - 5.11 MIL/uL   Hemoglobin 12.8 12.0 - 15.0 g/dL   HCT 38.8 36.0 - 46.0 %   MCV 93.5 78.0 - 100.0 fL   MCH 30.8 26.0 - 34.0 pg   MCHC 33.0 30.0 - 36.0 g/dL   RDW 13.4 11.5 - 15.5 %   Platelets 116 (L) 150 - 400 K/uL    Comment: PLATELET COUNT CONFIRMED BY SMEAR   Neutrophils Relative % 62 %   Neutro Abs 4.7 1.7 - 7.7 K/uL   Lymphocytes Relative 26 %   Lymphs Abs 2.0 0.7 - 4.0 K/uL   Monocytes Relative 9 %   Monocytes Absolute 0.7 0.1 - 1.0 K/uL   Eosinophils Relative 4 %   Eosinophils Absolute 0.3 0.0 - 0.7 K/uL   Basophils Relative 0 %   Basophils Absolute 0.0 0.0 - 0.1 K/uL  Platelet inhibition p2y12 (not at [facility])     Status: None   Collection Time: 09/07/16  6:45 AM  Result Value Ref Range   Platelet Function  P2Y12 214 194 - 418 PRU    Comment:        The literature has shown a direct correlation of PRU values over 230 with higher risks of thrombotic events.  Lower PRU values are associated with platelet inhibition.   Protime-INR     Status: None   Collection Time: 09/07/16  6:45 AM  Result Value Ref  Range   Prothrombin Time 13.9 11.4 - 15.2 seconds   INR 1.07     Imaging: No results found.  Assessment/Plan 1. Left ICA aneurysm 2. Heart murmur 3. Sinus arrhythmia  -After evaluation by NIR and anesthesia today, we feel that given these cardiac findings today, that the patient would need cardiology clearance prior to being put under general anesthesia.  We will proceed with a diagnostic cerebral angiogram, but will NOT pursue intervention today. -Dr. Estanislado Pandy would  like to see if cardiology can evaluate her when she returns to short stay to get her set up with them for evaluation. -labs and vitals otherwise reviewed. -she is bradycardic in the 40s and somewhat irregular, but felt to be in sinus still.  She does take atenolol.  Will defer to cardiology and primary provider to further address this. -all of this was discussed with the patient and her husband and they understand. -Risks and Benefits discussed with the patient including, but not limited to bleeding, infection, vascular injury or contrast induced renal failure. All of the patient's questions were answered, patient is agreeable to proceed. Consent signed and in chart.  Thank you for this interesting consult.  I greatly enjoyed meeting TIANNI ESCAMILLA and look forward to participating in their care.  A copy of this report was sent to the requesting provider on this date.  Electronically Signed: Henreitta Cea 09/07/2016, 9:29 AM   I spent a total of    40 Minutes in face to face in clinical consultation, greater than 50% of which was counseling/coordinating care for left ica aneurysm

## 2016-09-07 NOTE — Progress Notes (Signed)
BR complete at 1415.  Pt ambulated to bathroom without difficulty.  Corine ShelterLuke Kilroy, [place]PA and Dr Jens Somrenshaw in to see pt

## 2016-09-07 NOTE — Progress Notes (Signed)
Notified Barnetta ChapelKelly Osborne, PA of p2Y12 or 214. Gave order to give 150mg  plavix, pt had 81mg  aspirin this morning, per Tresa EndoKelly this is ok, no other aspirin ordered.

## 2016-09-07 NOTE — Discharge Instructions (Signed)
Femoral Site Care °Refer to this sheet in the next few weeks. These instructions provide you with information about caring for yourself after your procedure. Your health care provider may also give you more specific instructions. Your treatment has been planned according to current medical practices, but problems sometimes occur. Call your health care provider if you have any problems or questions after your procedure. °What can I expect after the procedure? °After your procedure, it is typical to have the following: °· Bruising at the site that usually fades within 1-2 weeks. °· Blood collecting in the tissue (hematoma) that may be painful to the touch. It should usually decrease in size and tenderness within 1-2 weeks. °Follow these instructions at home: °· Take medicines only as directed by your health care provider. °· You may shower 24-48 hours after the procedure or as directed by your health care provider. Remove the bandage (dressing) and gently wash the site with plain soap and water. Pat the area dry with a clean towel. Do not rub the site, because this may cause bleeding. °· Do not take baths, swim, or use a hot tub until your health care provider approves. °· Check your insertion site every day for redness, swelling, or drainage. °· Do not apply powder or lotion to the site. °· Limit use of stairs to twice a day for the first 2-3 days or as directed by your health care provider. °· Do not squat for the first 2-3 days or as directed by your health care provider. °· Do not lift over 10 lb (4.5 kg) for 5 days after your procedure or as directed by your health care provider. °· Ask your health care provider when it is okay to: °¨ Return to work or school. °¨ Resume usual physical activities or sports. °¨ Resume sexual activity. °· Do not drive home if you are discharged the same day as the procedure. Have someone else drive you. °· You may drive 24 hours after the procedure unless otherwise instructed by  your health care provider. °· Do not operate machinery or power tools for 24 hours after the procedure or as directed by your health care provider. °· If your procedure was done as an outpatient procedure, which means that you went home the same day as your procedure, a responsible adult should be with you for the first 24 hours after you arrive home. °· Keep all follow-up visits as directed by your health care provider. This is important. °Contact a health care provider if: °· You have a fever. °· You have chills. °· You have increased bleeding from the site. Hold pressure on the site. °Get help right away if: °· You have unusual pain at the site. °· You have redness, warmth, or swelling at the site. °· You have drainage (other than a small amount of blood on the dressing) from the site. °· The site is bleeding, and the bleeding does not stop after 30 minutes of holding steady pressure on the site. °· Your leg or foot becomes pale, cool, tingly, or numb. °This information is not intended to replace advice given to you by your health care provider. Make sure you discuss any questions you have with your health care provider. °Document Released: 02/09/2014 Document Revised: 11/14/2015 Document Reviewed: 12/26/2013 °Elsevier Interactive Patient Education © 2017 [employer] °Moderate Conscious Sedation, Adult, Care After °These instructions provide you with information about caring for yourself after your procedure. Your health care provider may also give you more specific   instructions. Your treatment has been planned according to current medical practices, but problems sometimes occur. Call your health care provider if you have any problems or questions after your procedure. °What can I expect after the procedure? °After your procedure, it is common: °· To feel sleepy for several hours. °· To feel clumsy and have poor balance for several hours. °· To have poor judgment for several hours. °· To vomit if you eat too  soon. °Follow these instructions at home: °For at least 24 hours after the procedure:  ° °· Do not: °¨ Participate in activities where you could fall or become injured. °¨ Drive. °¨ Use heavy machinery. °¨ Drink alcohol. °¨ Take sleeping pills or medicines that cause drowsiness. °¨ Make important decisions or sign legal documents. °¨ Take care of children on your own. °· Rest. °Eating and drinking  °· Follow the diet recommended by your health care provider. °· If you vomit: °¨ Drink water, juice, or soup when you can drink without vomiting. °¨ Make sure you have little or no nausea before eating solid foods. °General instructions  °· Have a responsible adult stay with you until you are awake and alert. °· Take over-the-counter and prescription medicines only as told by your health care provider. °· If you smoke, do not smoke without supervision. °· Keep all follow-up visits as told by your health care provider. This is important. °Contact a health care provider if: °· You keep feeling nauseous or you keep vomiting. °· You feel light-headed. °· You develop a rash. °· You have a fever. °Get help right away if: °· You have trouble breathing. °This information is not intended to replace advice given to you by your health care provider. Make sure you discuss any questions you have with your health care provider. °Document Released: 03/29/2013 Document Revised: 11/11/2015 Document Reviewed: 09/28/2015 °Elsevier Interactive Patient Education © 2017 [employer] ° °

## 2016-09-07 NOTE — Consult Note (Addendum)
Reason for Consult:   Chest pain, pre op clearance  Requesting Physician: Dr Corliss Skainseveshwar Primary Cardiologist New  HPI:   83 y/o [place] from [place], lives at [facility] [place] and is followed by Amedeo KinsmanHolly Tubyfill NP. She has no history of CAD or MI. 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 She was asymptomatic then and opted for no treatment. She recently had developed headaches and was seen by Dr Antonietta Barcelonaonuzi and referred to Dr Corliss Skainseveshwar for embolization. She was admitted for this today and mentioned she had noticed some chest pain-"light touching pain" mid chest. Not exertional though she is unable to do much secondary to spinal stenosis. She admitted to me she has had this "for years". She was also noted to have a systolic murmur on exam and an "irregular heart rhythm". The embolization would require anesthesia and the anesthesiologist was not comfortable proceeding without cardiac clearance. She did have her angiogram without complications.    PMHx:  Past Medical History:  Diagnosis Date  . Aneurysm (HCC)    cerebral" no surgical repair"- frquent headaches . or lessening-Dr. Tonuzi-[id number],neurology  . Arthritis    Osteoarthritis- "spinal stenosis"- hip and back pain.  . Back pain    chronic back pain, cannot lie flat  . GERD (gastroesophageal reflux disease)   . Headache   . Heart rate slow    history of  . Hypercholesteremia   . Hypertension   . Hypothyroidism    childhood years- no issues    Past Surgical History:  Procedure Laterality Date  . APPENDECTOMY    . CATARACT EXTRACTION, BILATERAL Bilateral   . IR GENERIC HISTORICAL  06/30/2016   IR RADIOLOGIST EVAL & MGMT 06/30/2016 [facility]  . IR GENERIC HISTORICAL  07/30/2016   IR RADIOLOGIST EVAL & MGMT 07/30/2016 [facility]  . TONSILLECTOMY    . TOTAL HIP ARTHROPLASTY Left 08/27/2015   Procedure: LEFT TOTAL HIP ARTHROPLASTY ANTERIOR APPROACH;  Surgeon: Durene RomansMatthew Olin, MD;   Location: [facility];  Service: Orthopedics;  Laterality: Left;    SOCHx:  reports that she has quit smoking. Her smoking use included Cigarettes. She has never used smokeless tobacco. She reports that she drinks alcohol. She reports that she does not use drugs.  FAMHx: both her parents died from complications from an MI in they're 5870's   ALLERGIES: Allergies  Allergen Reactions  . Hydrochlorothiazide W-Spironolactone Rash  . Niacin Rash and Other (See Comments)  . Penicillins Rash    Has patient had a PCN reaction causing immediate rash, facial/tongue/throat swelling, SOB or lightheadedness with hypotension: Yes Has patient had a PCN reaction causing severe rash involving mucus membranes or skin necrosis: No Has patient had a PCN reaction that required hospitalization No Has patient had a PCN reaction occurring within the last 10 years: Yes If all of the above answers are "NO", then may proceed with Cephalosporin use.  . Raloxifene Rash  . Tussionex Pennkinetic Er [Hydrocod Polst-Cpm Polst Er] Rash    ROS: Review of Systems: General: negative for chills, fever, night sweats or weight changes.  Cardiovascular: negative for edema, orthopnea, palpitations, paroxysmal nocturnal dyspnea or shortness of breath HEENT: negative for any visual disturbances, blindness, glaucoma Dermatological: negative for rash Respiratory: negative for cough, hemoptysis, or wheezing Urologic: negative for hematuria or dysuria Abdominal: negative for nausea, vomiting, diarrhea, bright red blood per rectum, melena, or hematemesis Neurologic: negative for visual changes, syncope, or dizziness Musculoskeletal: negative  for back pain, joint pain, or swelling Psych: cooperative and appropriate All other systems reviewed and are otherwise negative except as noted above.   HOME MEDICATIONS: Prior to Admission medications   Medication Sig Start Date End Date Taking? Authorizing Provider  acetaminophen (TYLENOL)  500 MG tablet Take 2 tablets (1,000 mg total) by mouth every 8 (eight) hours. Patient taking differently: Take 1,000 mg by mouth 2 (two) times daily.  08/29/15  Yes Lanney Gins, PA-C  aspirin EC 81 MG tablet Take 81 mg by mouth daily.   Yes Historical Provider, MD  atenolol (TENORMIN) 25 MG tablet Take 25 mg by mouth 2 (two) times daily.   Yes Historical Provider, MD  atorvastatin (LIPITOR) 10 MG tablet Take 10 mg by mouth every evening.    Yes Historical Provider, MD  bimatoprost (LUMIGAN) 0.01 % SOLN Place 1 drop into both eyes at bedtime.    Yes Historical Provider, MD  bismuth subsalicylate (PEPTO BISMOL) 262 MG/15ML suspension Take 30 mLs by mouth every 6 (six) hours as needed.   Yes Historical Provider, MD  clopidogrel (PLAVIX) 75 MG tablet Take 75 mg by mouth at bedtime.   Yes Historical Provider, MD  furosemide (LASIX) 20 MG tablet Take 20 mg by mouth every Monday, Wednesday, and Friday.   Yes Historical Provider, MD  Melatonin 3 MG TABS Take 3 mg by mouth at bedtime as needed (sleep).   Yes Historical Provider, MD  Menthol, Topical Analgesic, (ICY HOT EX) Apply 1 application topically daily as needed (pain).   Yes Historical Provider, MD  omeprazole (PRILOSEC) 20 MG capsule Take 20 mg by mouth daily.   Yes Historical Provider, MD  docusate sodium (COLACE) 100 MG capsule Take 1 capsule (100 mg total) by mouth 2 (two) times daily. Patient taking differently: Take 100 mg by mouth 2 (two) times daily as needed for mild constipation or moderate constipation.  08/29/15   Lanney Gins, PA-C  ferrous sulfate 325 (65 FE) MG tablet Take 1 tablet (325 mg total) by mouth 3 (three) times daily after meals. Patient not taking: Reported on 08/26/2016 08/29/15   Lanney Gins, PA-C  gabapentin (NEURONTIN) 300 MG capsule Take 300 mg by mouth 3 (three) times daily.     Historical Provider, MD  polyethylene glycol (MIRALAX / GLYCOLAX) packet Take 17 g by mouth 2 (two) times daily. Patient taking differently:  Take 17 g by mouth daily as needed for mild constipation.  08/29/15   Lanney Gins, PA-C  traMADol (ULTRAM) 50 MG tablet Take 1-2 tablets (50-100 mg total) by mouth every 6 (six) hours as needed for moderate pain or severe pain. Patient taking differently: Take 50-100 mg by mouth every 6 (six) hours as needed for moderate pain or severe pain. Depends on headache pain if takes 1-2 tablets 08/29/15   Lanney Gins, [facility] MEDICATIONS: I have reviewed the patient's current medications.  VITALS: Blood pressure (!) 146/94, pulse 63, temperature 97.7 F (36.5 C), temperature source Oral, resp. rate 18, height 5\' 3"  (1.6 m), weight 152 lb (68.9 kg), SpO2 98 %.  PHYSICAL EXAM: General appearance: alert, cooperative, no distress and a little anxious Neck: no carotid bruit and no JVD Lungs: clear to auscultation bilaterally Heart: regular rate and rhythm and 2/6 mid systolic murmur heard throughout precordium, preserved S2 Abdomen: soft, non-tender; bowel sounds normal; no masses,  no organomegaly Extremities: Rt FA without bruit, no LE edema Pulses: 2+ and symmetric Skin: Skin color, texture, turgor normal. No  rashes or lesions Neurologic: Grossly normal  LABS: Results for orders placed or performed during the hospital encounter of 09/07/16 (from the past 24 hour(s))  APTT     Status: None   Collection Time: 09/07/16  6:45 AM  Result Value Ref Range   aPTT 33 24 - 36 seconds  Basic metabolic panel     Status: Abnormal   Collection Time: 09/07/16  6:45 AM  Result Value Ref Range   Sodium 142 135 - 145 mmol/L   Potassium 4.5 3.5 - 5.1 mmol/L   Chloride 108 101 - 111 mmol/L   CO2 24 22 - 32 mmol/L   Glucose, Bld 105 (H) 65 - 99 mg/dL   BUN 14 6 - 20 mg/dL   Creatinine, Ser 2.53 0.44 - 1.00 mg/dL   Calcium 9.1 8.9 - 66.4 mg/dL   GFR calc non Af Amer 54 (L) >60 mL/min   GFR calc Af Amer >60 >60 mL/min   Anion gap 10 5 - 15  CBC WITH DIFFERENTIAL     Status: Abnormal    Collection Time: 09/07/16  6:45 AM  Result Value Ref Range   WBC 7.6 4.0 - 10.5 K/uL   RBC 4.15 3.87 - 5.11 MIL/uL   Hemoglobin 12.8 12.0 - 15.0 g/dL   HCT 40.3 47.4 - 25.9 %   MCV 93.5 78.0 - 100.0 fL   MCH 30.8 26.0 - 34.0 pg   MCHC 33.0 30.0 - 36.0 g/dL   RDW 56.3 87.5 - 64.3 %   Platelets 116 (L) 150 - 400 K/uL   Neutrophils Relative % 62 %   Neutro Abs 4.7 1.7 - 7.7 K/uL   Lymphocytes Relative 26 %   Lymphs Abs 2.0 0.7 - 4.0 K/uL   Monocytes Relative 9 %   Monocytes Absolute 0.7 0.1 - 1.0 K/uL   Eosinophils Relative 4 %   Eosinophils Absolute 0.3 0.0 - 0.7 K/uL   Basophils Relative 0 %   Basophils Absolute 0.0 0.0 - 0.1 K/uL  Platelet inhibition p2y12 (not at [facility])     Status: None   Collection Time: 09/07/16  6:45 AM  Result Value Ref Range   Platelet Function  P2Y12 214 194 - 418 PRU  Protime-INR     Status: None   Collection Time: 09/07/16  6:45 AM  Result Value Ref Range   Prothrombin Time 13.9 11.4 - 15.2 seconds   INR 1.07     EKG: NSR, SB 54  IMAGING: No results found.  IMPRESSION:  Pre op cardiovascular clearance-  Chest pain- atypical for angina but multiple cardiac risk factors  PVD- LICA aneurysm  HTN- B/P los post angio  Murmur- systolic murmur, ? AOV stenosis  HLD- on statin prior to adm  Spinal stenosis- limited mobility   RECOMMENDATION: MD to see- plan OP echo and Myoview, if these are OK she will be cleared for embolization, f/u with Dr Jens Som in a few months.   Time Spent Directly with Patient: 45 minutes  Corine Shelter, [place]  [phone] beeper 09/07/2016, 2:28 PM   As above; pt seen and examined; briefly 83 yo female for preoperative evaluation prior to coiling of LICA aneurysm. Pt has had occasional nonexertional CP for years; lasts 3 min and resolves spontaneously; no associated symptoms; denies exertional CP or DOE but ambulates with walker. ECG-sinus brady with no ST changes. Exam shows 3/6 systolic murmur; S2 not  diminished Plan echo to assess AS; Lexiscan nuclear study to screen for CAD (unable to  assess functional capacity); if AS not severe and nuclear study normal or low risk, she may proceed with surgery. Studies will be arranged as outpt. Olga Millers

## 2016-09-07 NOTE — Sedation Documentation (Signed)
Gauze/tegaderm bandage applied to R fem artery puncture site. Groin level 0, 3+RDP, RPT + Doppler.

## 2016-09-07 NOTE — Anesthesia Preprocedure Evaluation (Signed)
Anesthesia Evaluation  Patient identified by MRN, date of birth, ID band Patient awake    Reviewed: Allergy & Precautions, NPO status , Patient's Chart, lab work & pertinent test results  History of Anesthesia Complications Negative for: history of anesthetic complications  Airway Mallampati: II  TM Distance: >3 FB Neck ROM: Full    Dental  (+) Teeth Intact   Pulmonary neg shortness of breath, neg sleep apnea, neg COPD, neg recent URI, former smoker,    breath sounds clear to auscultation       Cardiovascular hypertension, Pt. on medications and Pt. on home beta blockers (-) angina(-) Past MI and (-) CHF  Rhythm:Irregular + Systolic murmurs    Neuro/Psych  Headaches, negative psych ROS   GI/Hepatic GERD  ,  Endo/Other  Hypothyroidism   Renal/GU      Musculoskeletal   Abdominal   Peds  Hematology   Anesthesia Other Findings   Reproductive/Obstetrics                             Anesthesia Physical Anesthesia Plan  ASA: III  Anesthesia Plan: MAC   Post-op Pain Management:    Induction: Intravenous  Airway Management Planned: Natural Airway, Nasal Cannula and Simple Face Mask  Additional Equipment: None  Intra-op Plan:   Post-operative Plan:   Informed Consent: I have reviewed the patients History and Physical, chart, labs and discussed the procedure including the risks, benefits and alternatives for the proposed anesthesia with the patient or authorized representative who has indicated his/her understanding and acceptance.   Dental advisory given  Plan Discussed with: CRNA and Surgeon  Anesthesia Plan Comments:         Anesthesia Quick Evaluation

## 2016-09-08 ENCOUNTER — Other Ambulatory Visit (HOSPITAL_COMMUNITY): Payer: Self-pay | Admitting: Interventional Radiology

## 2016-09-08 ENCOUNTER — Encounter (HOSPITAL_COMMUNITY): Payer: Self-pay | Admitting: Interventional Radiology

## 2016-09-08 DIAGNOSIS — I671 Cerebral aneurysm, nonruptured: Secondary | ICD-10-CM

## 2016-09-08 NOTE — Anesthesia Postprocedure Evaluation (Addendum)
Anesthesia Post Note  Patient: Kara Lester  Procedure(s) Performed: Procedure(s) (LRB): embolization (N/A)  Patient location during evaluation: PACU Anesthesia Type: MAC Level of consciousness: awake and alert Pain management: pain level controlled Vital Signs Assessment: post-procedure vital signs reviewed and stable Respiratory status: spontaneous breathing, nonlabored ventilation, respiratory function stable and patient connected to nasal cannula oxygen Cardiovascular status: stable and blood pressure returned to baseline Anesthetic complications: no       Last Vitals:  Vitals:   09/07/16 0709 09/07/16 0802  BP: (!) 148/89 (!) 146/94  Pulse: (!) 55 63  Resp: 18   Temp: 36.5 C     Last Pain:  Vitals:   09/07/16 0709  TempSrc: Oral                 Taber Sweetser

## 2016-09-11 ENCOUNTER — Other Ambulatory Visit: Payer: Self-pay | Admitting: Radiology

## 2016-09-15 ENCOUNTER — Telehealth: Payer: Self-pay | Admitting: Cardiology

## 2016-09-15 NOTE — Telephone Encounter (Signed)
Closed encounter °

## 2016-09-22 ENCOUNTER — Telehealth (HOSPITAL_COMMUNITY): Payer: Self-pay | Admitting: *Deleted

## 2016-09-22 NOTE — Telephone Encounter (Signed)
Patient not feeling well and told me to give instructions to husband.Patient's husband  given detailed instructions per Myocardial Perfusion Study Information Sheet for the test on 09/24/16 at 1000. Patient notified to arrive 15 minutes early and that it is imperative to arrive on time for appointment to keep from having the test rescheduled.  If you need to cancel or reschedule your appointment, please call the office within 24 hours of your appointment. Failure to do so may result in a cancellation of your appointment, and a $50 no show fee. Patient verbalized understanding.Ieesha Abbasi, Adelene Idler

## 2016-09-23 ENCOUNTER — Other Ambulatory Visit (HOSPITAL_COMMUNITY): Payer: Medicare Other

## 2016-09-23 ENCOUNTER — Telehealth (HOSPITAL_COMMUNITY): Payer: Self-pay | Admitting: Radiology

## 2016-09-23 NOTE — Telephone Encounter (Signed)
Pt called to say that she has rescheduled her appointment with cardiology and therefore needs to cancel her scheduled procedure for aneurysm treatment with Deveshwar on 09/28/16. She will call back after she sees cardiology and let us know if she still wants to proceed with the treatment of the aneurysm. JM

## 2016-09-24 ENCOUNTER — Encounter (HOSPITAL_COMMUNITY): Payer: Medicare Other

## 2016-09-24 ENCOUNTER — Other Ambulatory Visit (HOSPITAL_COMMUNITY): Payer: Medicare Other

## 2016-09-28 ENCOUNTER — Ambulatory Visit (HOSPITAL_COMMUNITY): Payer: Medicare Other

## 2016-10-05 ENCOUNTER — Telehealth (HOSPITAL_COMMUNITY): Payer: Self-pay | Admitting: Radiology

## 2016-10-05 ENCOUNTER — Telehealth (HOSPITAL_COMMUNITY): Payer: Self-pay | Admitting: *Deleted

## 2016-10-05 NOTE — Telephone Encounter (Signed)
Patient given detailed instructions per Myocardial Perfusion Study Information Sheet for the test on 10/07/2016 at 9:45. Patient notified to arrive 15 minutes early and that it is imperative to arrive on time for appointment to keep from having the test rescheduled.  If you need to cancel or reschedule your appointment, please call the office within 24 hours of your appointment. Failure to do so may result in a cancellation of your appointment, and a $50 no show fee. Patient verbalized understanding.EHK

## 2016-10-05 NOTE — Telephone Encounter (Signed)
Patient given detailed instructions per Myocardial Perfusion Study Information Sheet for the test on 10/07/16 at 0945. Patient notified to arrive 15 minutes early and that it is imperative to arrive on time for appointment to keep from having the test rescheduled.  If you need to cancel or reschedule your appointment, please call the office within 24 hours of your appointment. Failure to do so may result in a cancellation of your appointment, and a $50 no show fee. Patient verbalized understanding.Kara Lester    

## 2016-10-07 ENCOUNTER — Ambulatory Visit (HOSPITAL_COMMUNITY): Payer: Medicare Other | Attending: Cardiology

## 2016-10-07 ENCOUNTER — Other Ambulatory Visit: Payer: Self-pay

## 2016-10-07 ENCOUNTER — Ambulatory Visit (HOSPITAL_BASED_OUTPATIENT_CLINIC_OR_DEPARTMENT_OTHER): Payer: Medicare Other

## 2016-10-07 DIAGNOSIS — R079 Chest pain, unspecified: Secondary | ICD-10-CM | POA: Diagnosis present

## 2016-10-07 DIAGNOSIS — I083 Combined rheumatic disorders of mitral, aortic and tricuspid valves: Secondary | ICD-10-CM | POA: Diagnosis not present

## 2016-10-07 LAB — MYOCARDIAL PERFUSION IMAGING
LV dias vol: 51 mL (ref 46–106)
LV sys vol: 9 mL
Peak HR: 71 {beats}/min
RATE: 0.3
Rest HR: 48 {beats}/min
SDS: 11
SRS: 10
SSS: 21
TID: 1

## 2016-10-07 MED ORDER — TECHNETIUM TC 99M TETROFOSMIN IV KIT
10.6000 | PACK | Freq: Once | INTRAVENOUS | Status: AC | PRN
  Administered 2016-10-07: 10.6 via INTRAVENOUS
  Filled 2016-10-07: qty 11

## 2016-10-07 MED ORDER — REGADENOSON 0.4 MG/5ML IV SOLN
0.4000 mg | Freq: Once | INTRAVENOUS | Status: AC
  Administered 2016-10-07: 0.4 mg via INTRAVENOUS

## 2016-10-07 MED ORDER — TECHNETIUM TC 99M TETROFOSMIN IV KIT
32.9000 | PACK | Freq: Once | INTRAVENOUS | Status: AC | PRN
  Administered 2016-10-07: 32.9 via INTRAVENOUS
  Filled 2016-10-07: qty 33

## 2016-10-14 ENCOUNTER — Telehealth (HOSPITAL_COMMUNITY): Payer: Self-pay | Admitting: Radiology

## 2016-10-14 NOTE — Telephone Encounter (Signed)
Pt called and decided that due to the expense of just having the cerebral angiogram that she would not have the actual aneurysm treatment. She states it is "just too expensive." She states that should she change her mind she will call us back. JM

## 2016-11-23 NOTE — Addendum Note (Signed)
Addendum  created 11/23/16 1224 by Tameah Mihalko, MD   Sign clinical note    

## 2016-12-03 IMAGING — DX DG HIP (WITH OR WITHOUT PELVIS) 1V PORT*L*
2 series · 2 of 2 positions shown · non-contrast
Comparison: 05/20/2015

CLINICAL DATA: Postop

EXAM:
DG HIP (WITH OR WITHOUT PELVIS) 1V PORT LEFT

[hip lat (1 of 2)]
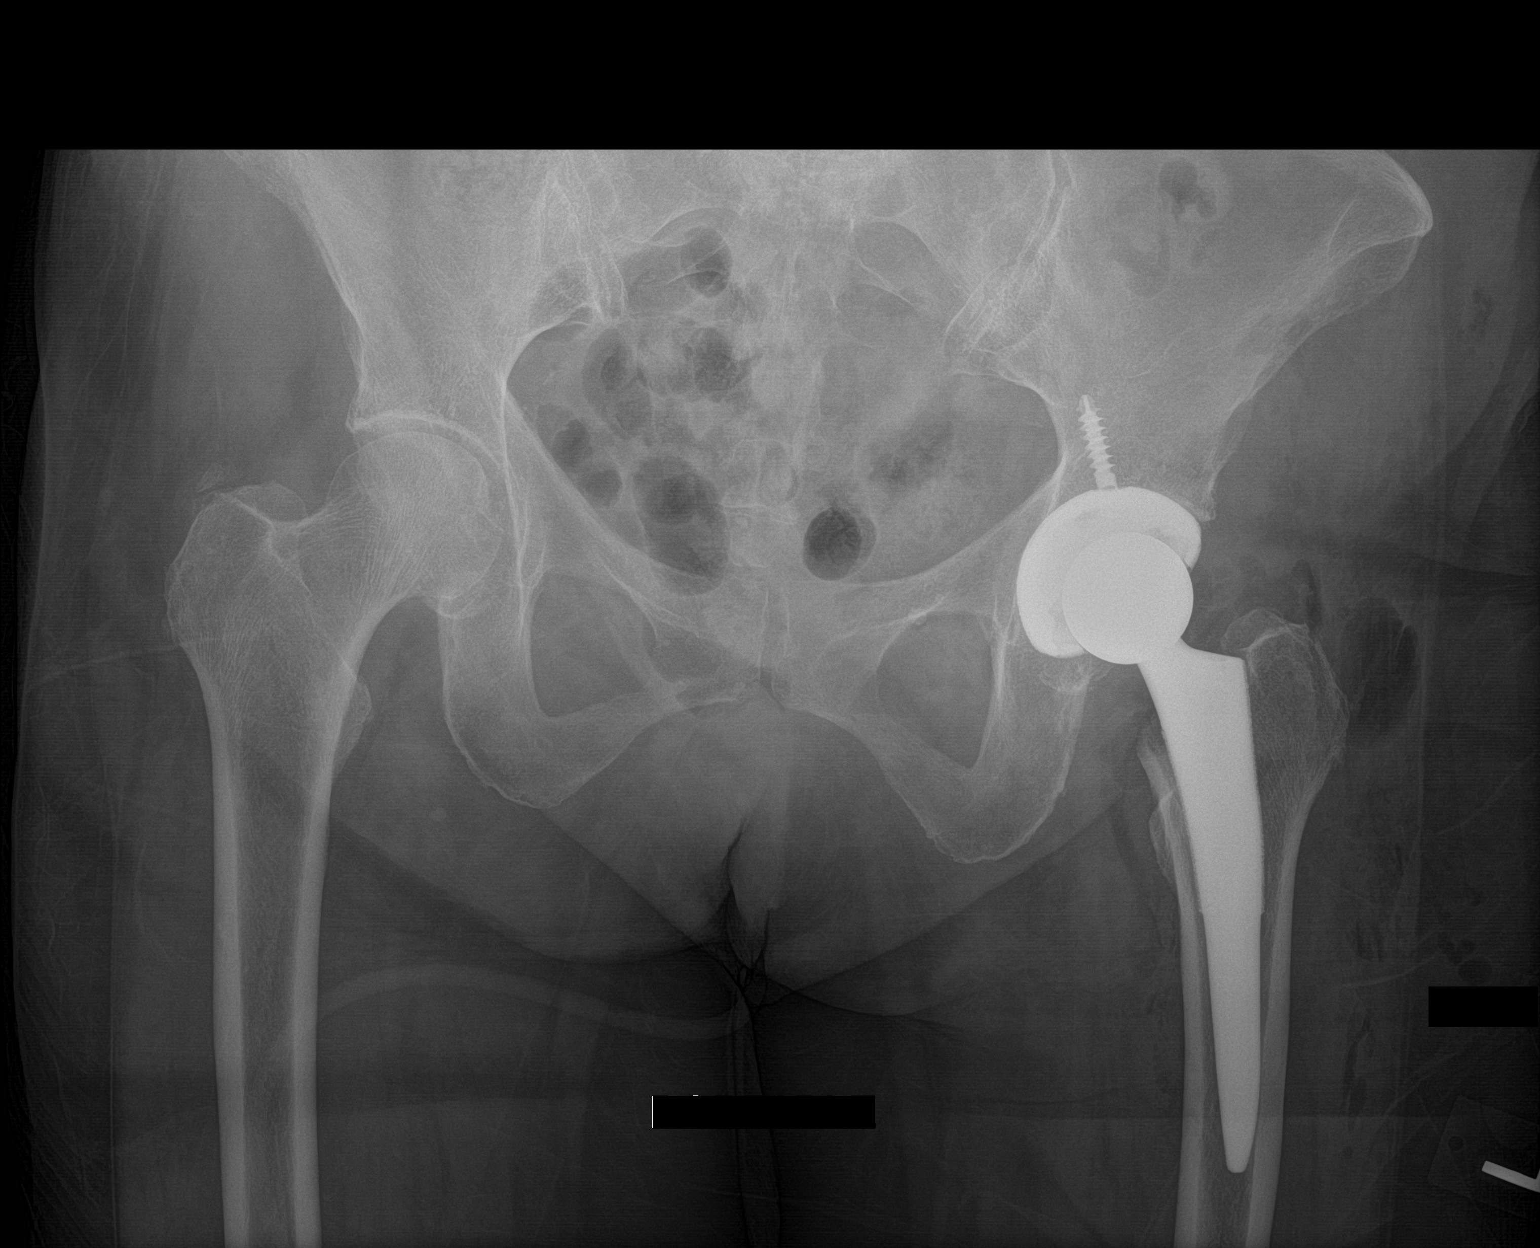

[hip lat (2 of 2)]
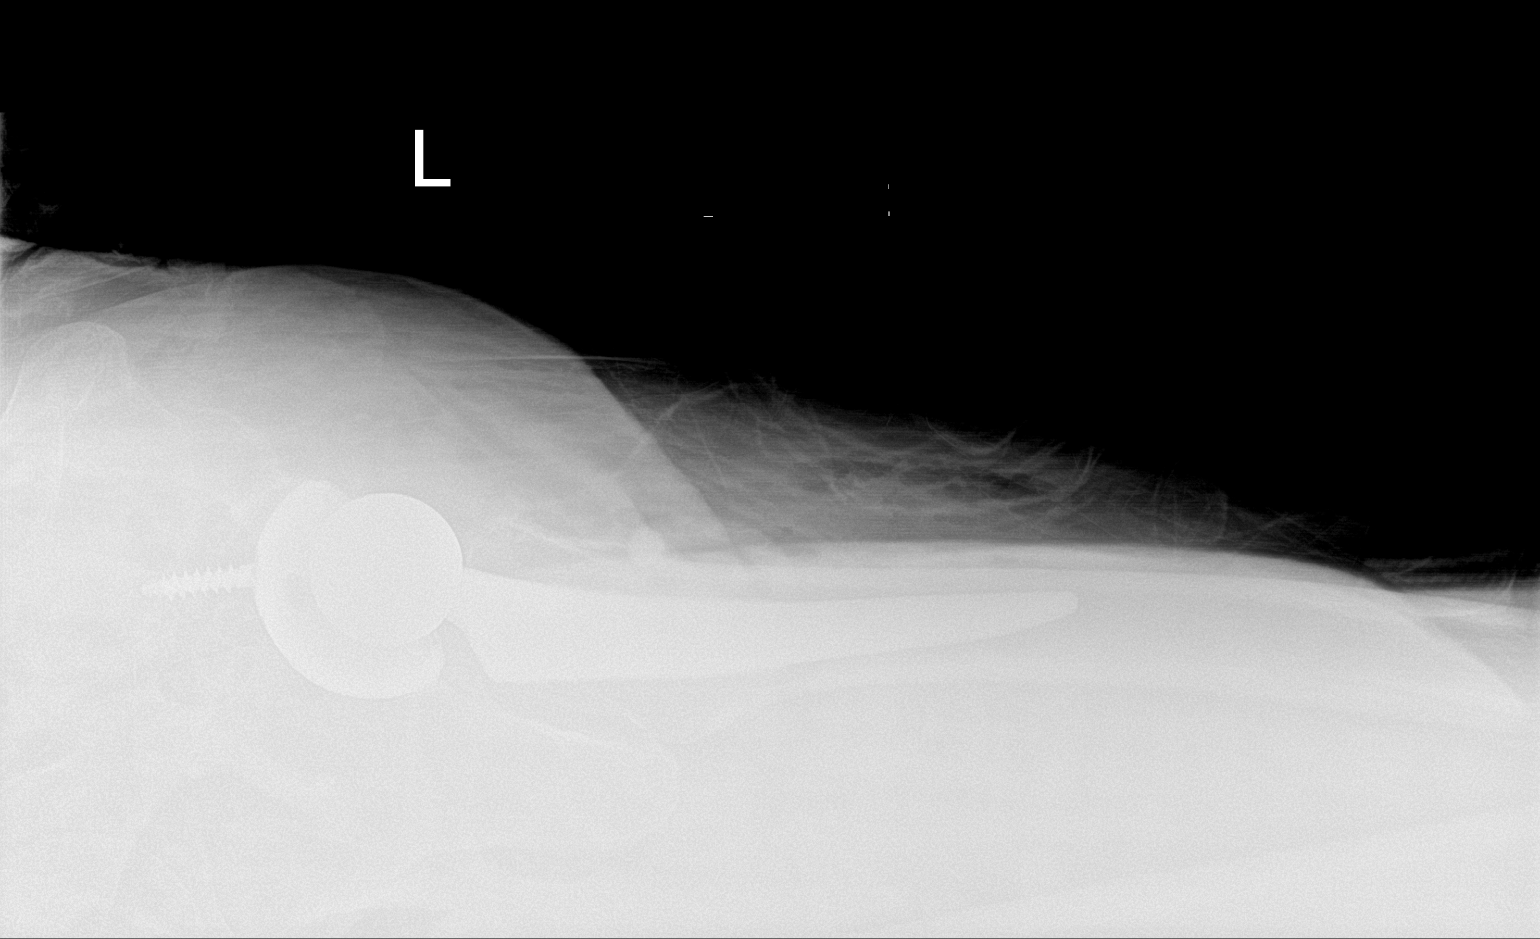

[2 of 2 positions shown; findings below may reference images not displayed]

FINDINGS: Left total hip arthroplasty is anatomically aligned. No breakage or
loosening of the hardware. No acute fracture.
IMPRESSION: Left total hip arthroplasty is anatomically aligned.

## 2017-10-21 IMAGING — MR MR HEAD WO/W CM
10 of 16 series · 27 of 48 positions shown · IV contrast (multihance)
Comparison: Catheter neuroangiogram September 01, 2013 and MRA head
July 12, 2013

CLINICAL DATA: Increasing headaches. Follow-up 7 x 5 mm LEFT para
ophthalmic aneurysm. Aneurysm. History of hypertension,
hypercholesterolemia.

EXAM:
MRI HEAD WITHOUT AND WITH CONTRAST
MRA HEAD WITHOUT CONTRAST
TECHNIQUE: Multiplanar, multiecho pulse sequences of the brain and surrounding
structures were obtained without and with intravenous contrast.
Angiographic images of the head were obtained using MRA technique
without contrast.
CONTRAST:  15mL MULTIHANCE GADOBENATE DIMEGLUMINE 529 MG/ML IV SOLN

[Series 3: T1 · sagittal · 5.0mm · 0.47mm/px · 1 of 23 slices shown]
[im 1/23]
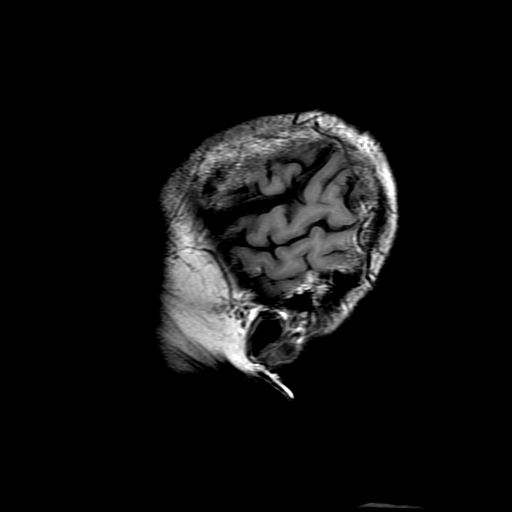

[Series 4: DWI · axial · 3.0mm · 1.09mm/px · z∈[-61,+71]mm · 5 of 90 slices shown (1 of 4)]
[im 1/90]
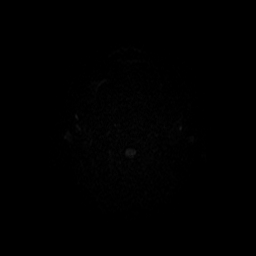
[im 23/90]
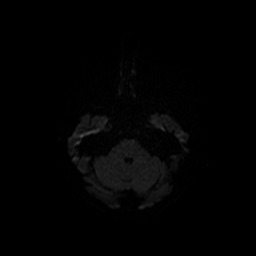
[im 45/90]
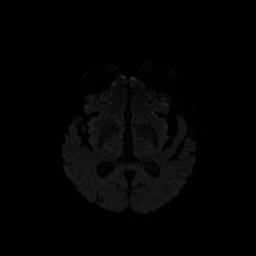
[im 67/90]
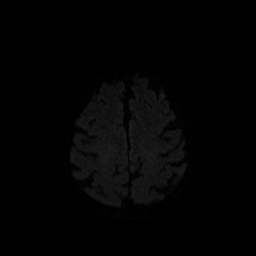
[im 90/90]
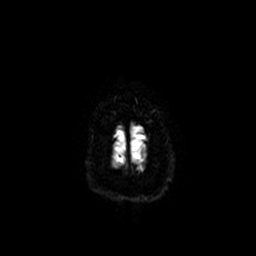

[Series 5: DWI · coronal · 5.0mm · 1.09mm/px · 4 of 64 slices shown (2 of 4)]
[im 1/64]
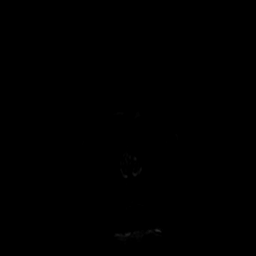
[im 22/64]
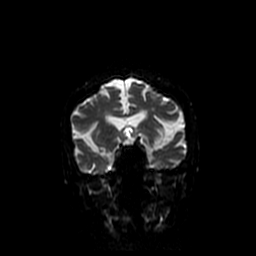
[im 43/64]
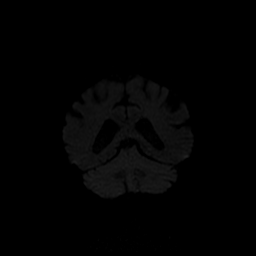
[im 64/64]
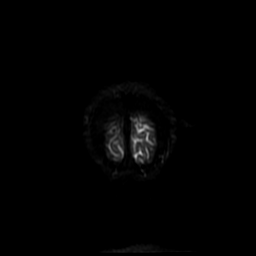

[Series 6: (id) mt fs · axial · 1.4mm · 0.43mm/px · z∈[-53,+6]mm · 5 of 152 slices shown]
[im 1/152]
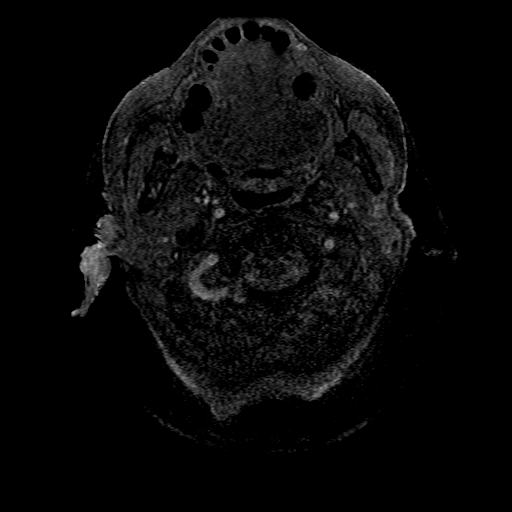
[im 17/152]
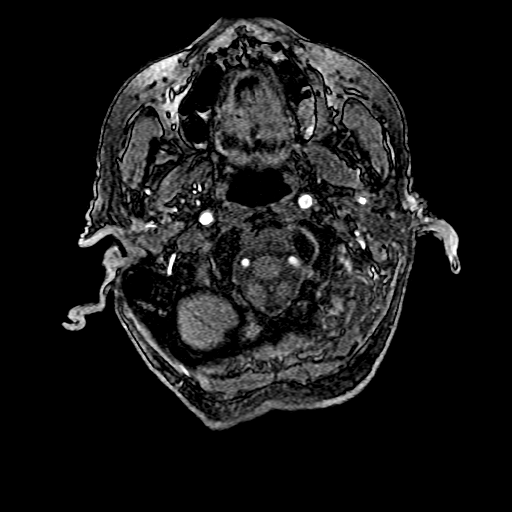
[im 51/152]
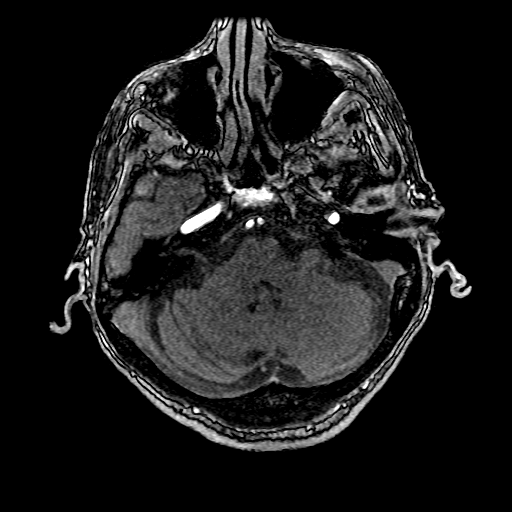
[im 68/152]
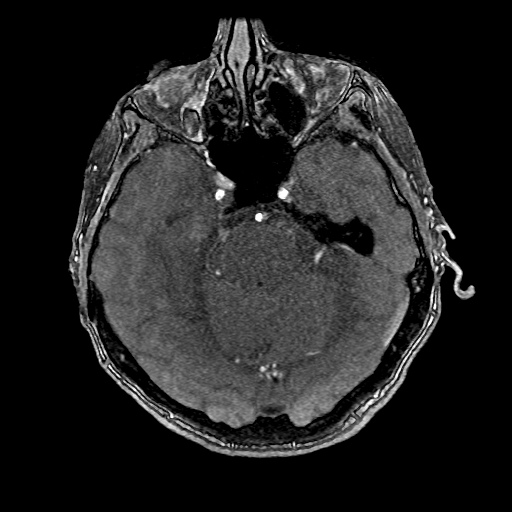
[im 84/152]
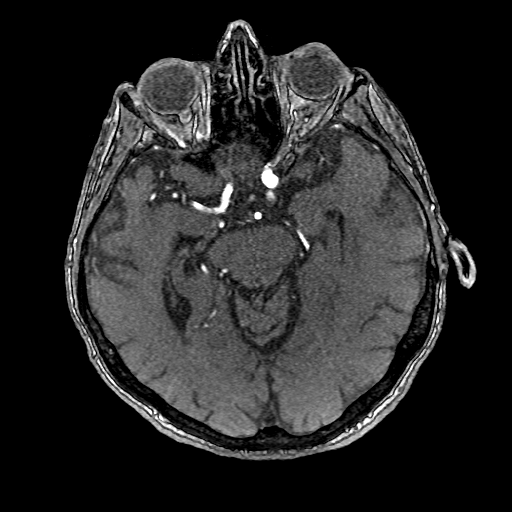

[Series 7: T2 · axial · 5.0mm · 0.43mm/px · 1 of 25 slices shown]
[im 1/25]
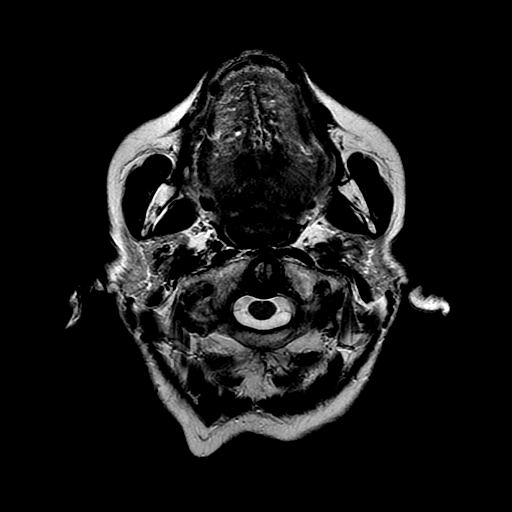

[Series 8: FLAIR · axial · 5.0mm · 0.43mm/px · z∈[-65,+82]mm · 2 of 22 slices shown]
[im 1/22]
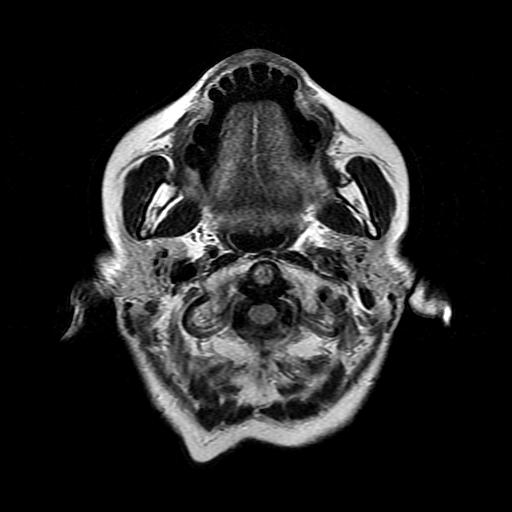
[im 22/22]
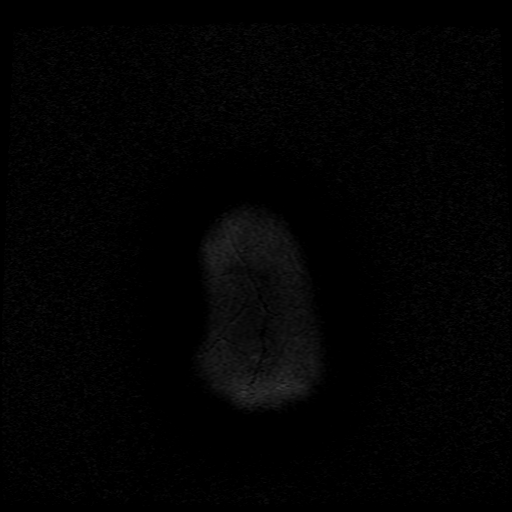

[Series 11: T2 post-contrast · coronal · 5.0mm · 0.39mm/px · 2 of 25 slices shown]
[im 1/25]
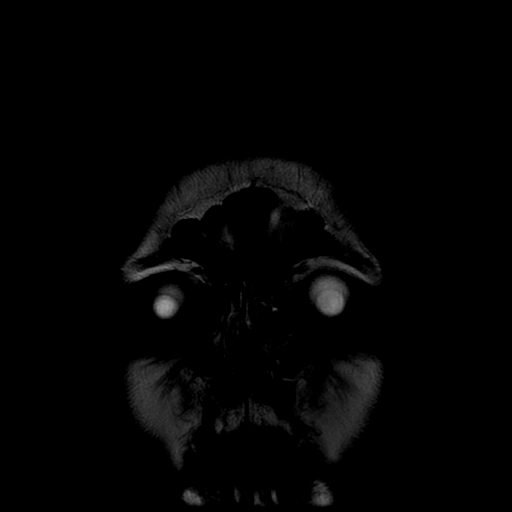
[im 25/25]
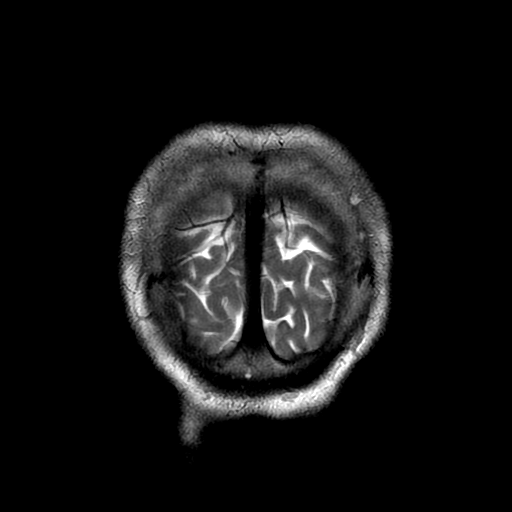

[Series 13: T1 post-contrast · coronal · 5.0mm · 0.39mm/px · 2 of 25 slices shown]
[im 1/25]
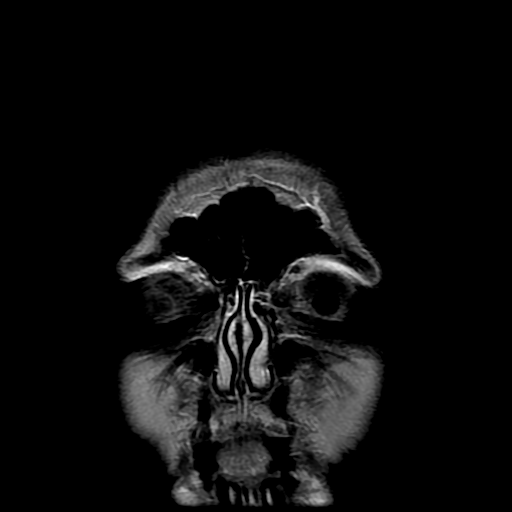
[im 25/25]
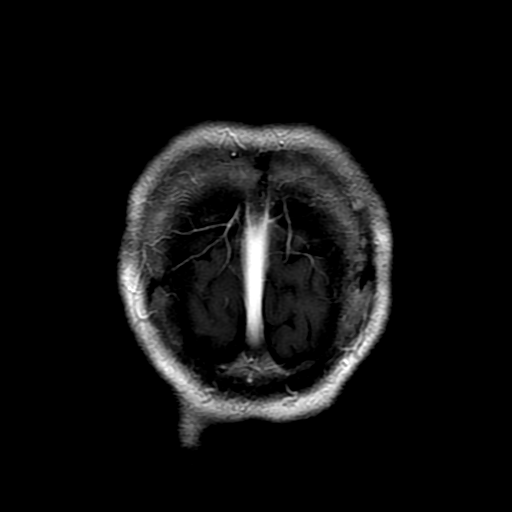

[Series 400: DWI · axial · 3.0mm · 1.09mm/px · z∈[-61,+71]mm · 3 of 45 slices shown (3 of 4)]
[im 1/45]
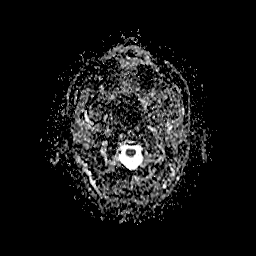
[im 23/45]
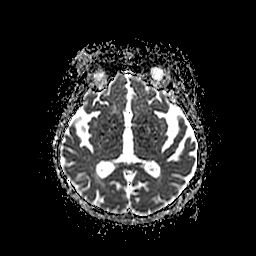
[im 45/45]
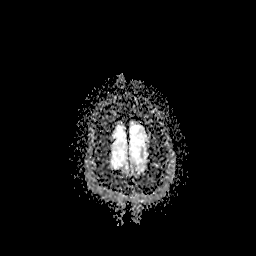

[Series 500: DWI · coronal · 5.0mm · 1.09mm/px · 2 of 32 slices shown (4 of 4)]
[im 1/32]
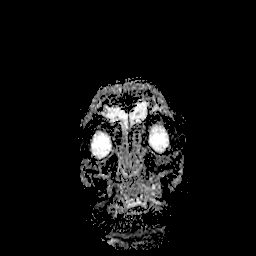
[im 32/32]
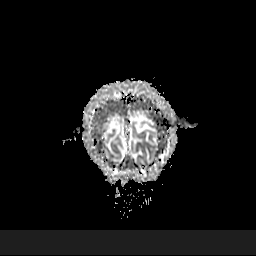

[27 of 48 positions shown; findings below may reference images not displayed]

FINDINGS: MRI HEAD FINDINGS

INTRACRANIAL CONTENTS: No reduced diffusion to suggest acute
ischemia. No susceptibility artifact to suggest hemorrhage. The
ventricles and sulci are normal for patient's age. Patchy to
confluent supratentorial and pontine white matter FLAIR T2
hyperintensities compatible flow with moderate chronic small vessel
ischemic disease. No suspicious parenchymal signal, masses, mass
effect. No abnormal intraparenchymal or extra-axial enhancement. No
abnormal extra-axial fluid collections. No extra-axial masses.

VASCULAR: Major intracranial vascular flow voids present at skull
base.

SKULL AND UPPER CERVICAL SPINE: No abnormal sellar expansion. No
suspicious calvarial bone marrow signal. Craniocervical junction
maintained. Mild degenerative change of the cervical spine.

SINUSES/ORBITS: The mastoid air-cells and included paranasal sinuses
are well-aerated. Status post bilateral ocular lens implants. The
included ocular globes and orbital contents are non-suspicious.

OTHER: None.

MRA HEAD FINDINGS

ANTERIOR CIRCULATION: Normal flow related enhancement of the
included cervical, petrous, cavernous and supraclinoid internal
carotid arteries. 5 x 4 mm wide necked medially directed LEFT
supraclinoid internal carotid artery 5 mm distal to the origin of
the ophthalmic artery. Similar fusiform ectatic LEFT supraclinoid
internal carotid artery. LEFT A1 segment is dominant. Supernumerary
anterior cerebral artery arising from LEFT A1-2 junction. Patent
anterior communicating artery. Normal flow related enhancement of
the anterior and middle cerebral arteries, including distal
segments.

No large vessel occlusion, high-grade stenosis, abnormal luminal
irregularity.

POSTERIOR CIRCULATION: Codominant vertebral arteries. Basilar artery
is patent, with normal flow related enhancement of the main branch
vessels. Normal flow related enhancement of the posterior cerebral
arteries.

No large vessel occlusion, high-grade stenosis, abnormal luminal
irregularity, aneurysm.
IMPRESSION: MRI HEAD: No acute intracranial process.

Moderate chronic small vessel ischemic disease.

MRA HEAD: Stable appearance of 5 x 4 mm LEFT supraclinoid/para
ophthalmic intact aneurysm.

No emergent large vessel occlusion or severe stenosis.

## 2018-04-22 DEATH — deceased
